# Patient Record
Sex: Female | Born: 1955
Health system: Southern US, Community
[De-identification: ages and names within clinical notes are randomized; demographics above are authoritative.]

## PROBLEM LIST (undated history)

## (undated) DIAGNOSIS — I10 Essential (primary) hypertension: Secondary | ICD-10-CM

## (undated) DIAGNOSIS — F32A Depression, unspecified: Secondary | ICD-10-CM

## (undated) HISTORY — PX: APPENDECTOMY: SHX54

---

## 1998-09-01 ENCOUNTER — Encounter: Admission: RE | Admit: 1998-09-01 | Discharge: 1998-09-01 | Payer: Self-pay | Admitting: Family Medicine

## 1998-09-18 ENCOUNTER — Other Ambulatory Visit: Admission: RE | Admit: 1998-09-18 | Discharge: 1998-09-18 | Payer: Self-pay | Admitting: *Deleted

## 2000-02-03 ENCOUNTER — Encounter: Admission: RE | Admit: 2000-02-03 | Discharge: 2000-02-03 | Payer: Self-pay | Admitting: Family Medicine

## 2000-02-18 ENCOUNTER — Encounter: Admission: RE | Admit: 2000-02-18 | Discharge: 2000-02-18 | Payer: Self-pay | Admitting: Family Medicine

## 2000-03-21 ENCOUNTER — Encounter: Admission: RE | Admit: 2000-03-21 | Discharge: 2000-03-21 | Payer: Self-pay | Admitting: Family Medicine

## 2001-06-21 ENCOUNTER — Encounter: Admission: RE | Admit: 2001-06-21 | Discharge: 2001-06-21 | Payer: Self-pay | Admitting: Family Medicine

## 2001-06-28 ENCOUNTER — Encounter: Admission: RE | Admit: 2001-06-28 | Discharge: 2001-06-28 | Payer: Self-pay | Admitting: Family Medicine

## 2005-11-01 ENCOUNTER — Emergency Department (HOSPITAL_COMMUNITY): Admission: EM | Admit: 2005-11-01 | Discharge: 2005-11-02 | Payer: Self-pay | Admitting: Emergency Medicine

## 2006-09-05 ENCOUNTER — Encounter (INDEPENDENT_AMBULATORY_CARE_PROVIDER_SITE_OTHER): Payer: Self-pay | Admitting: *Deleted

## 2006-09-05 LAB — CONVERTED CEMR LAB

## 2006-09-06 ENCOUNTER — Ambulatory Visit: Payer: Self-pay | Admitting: Sports Medicine

## 2006-09-06 ENCOUNTER — Other Ambulatory Visit: Admission: RE | Admit: 2006-09-06 | Discharge: 2006-09-06 | Payer: Self-pay | Admitting: Family Medicine

## 2006-10-09 ENCOUNTER — Emergency Department (HOSPITAL_COMMUNITY): Admission: EM | Admit: 2006-10-09 | Discharge: 2006-10-09 | Payer: Self-pay | Admitting: Emergency Medicine

## 2007-02-02 DIAGNOSIS — K649 Unspecified hemorrhoids: Secondary | ICD-10-CM | POA: Insufficient documentation

## 2007-02-02 DIAGNOSIS — J4489 Other specified chronic obstructive pulmonary disease: Secondary | ICD-10-CM | POA: Insufficient documentation

## 2007-02-02 DIAGNOSIS — R8789 Other abnormal findings in specimens from female genital organs: Secondary | ICD-10-CM

## 2007-02-02 DIAGNOSIS — F17200 Nicotine dependence, unspecified, uncomplicated: Secondary | ICD-10-CM | POA: Insufficient documentation

## 2007-02-02 DIAGNOSIS — J449 Chronic obstructive pulmonary disease, unspecified: Secondary | ICD-10-CM

## 2007-02-03 ENCOUNTER — Encounter (INDEPENDENT_AMBULATORY_CARE_PROVIDER_SITE_OTHER): Payer: Self-pay | Admitting: *Deleted

## 2020-09-30 ENCOUNTER — Encounter: Payer: Self-pay | Admitting: Internal Medicine

## 2020-12-09 ENCOUNTER — Ambulatory Visit: Payer: Medicare HMO

## 2020-12-09 ENCOUNTER — Ambulatory Visit: Payer: Medicare HMO | Admitting: Family Medicine

## 2020-12-09 ENCOUNTER — Other Ambulatory Visit: Payer: Self-pay

## 2020-12-09 ENCOUNTER — Ambulatory Visit (INDEPENDENT_AMBULATORY_CARE_PROVIDER_SITE_OTHER): Payer: Medicare HMO | Admitting: Family Medicine

## 2020-12-09 VITALS — BP 132/80 | HR 129 | Temp 96.1°F

## 2020-12-09 DIAGNOSIS — Z114 Encounter for screening for human immunodeficiency virus [HIV]: Secondary | ICD-10-CM

## 2020-12-09 DIAGNOSIS — F1010 Alcohol abuse, uncomplicated: Secondary | ICD-10-CM

## 2020-12-09 DIAGNOSIS — R112 Nausea with vomiting, unspecified: Secondary | ICD-10-CM | POA: Diagnosis not present

## 2020-12-09 DIAGNOSIS — R1110 Vomiting, unspecified: Secondary | ICD-10-CM | POA: Insufficient documentation

## 2020-12-09 NOTE — Patient Instructions (Signed)
It was a pleasure to see you today!  Thank you for choosing [facility] for your primary care.   Our plans for today were: - I will call you with the results - we will schedule you a ultrasound of your liver and gallbladder to further evaluate your nausea and vomiting.  Best Wishes,   Orpah Cobb, DO

## 2020-12-09 NOTE — Progress Notes (Signed)
Subjective:   Patient ID: Leah Woodward    DOB: 1956-05-26, 65 y.o. female   MRN: [id number]  Leah Woodward is a 65 y.o. female with a history of hemorrhoids, COPD, abnormal pap-smear, tobacco dependence here for nausea and vomiting.  Nausea, Vomiting: Nausea and vomiting x 2 weeks, intermittent, only occurs with certain foods such like hamburgers, cabbage. She has bene able to tolerate (baked chicken with noodles). Endorses low appetite.  Notes 3 episodes of vomiting. Mostly nausea after certain meals. Endorses low bilateral pain when her other symptoms occur. Vomiting helps her symptoms improve, but if she doesn't vomit then the nausea and stomach pain last about an hour. They self resolve. She has not taken anything for the symptoms. Her nausea and abdominal pain improve when she goes to the bathroom. Denies any bloody vomitus. Denies any diarrhea or constipation. Last bowel movement was this morning, brown in color, soft consistency. Denies any unexplained weight loss. Weight normally 108lbs. Denies any current symptoms. Denies any fever or chills. Endorses cough x years, slightly productive, about the same. H/o appendectomy. Eating and drinking okay today. Voiding normally. Denies any chest pain, SOB, or other symptoms with this nausea and vomiting.  Current Meds: none Tobacco: 1/3 PPD Alcohol: 1/2 pint QOD of Brandy  Illicit drugs: none  Denies any pertinent personal or family history Surigcal history: appendectomy    Review of Systems:  Per HPI.   Objective:   BP 132/80   Pulse (!) 129   Temp (!) 96.1 F (35.6 C) (Axillary)   SpO2 100%  Vitals and nursing note reviewed.  General: pleasant thin woman, sitting comfortably in exam chair, well nourished, well developed, in no acute distress with non-toxic appearance CV: regular rate and rhythm without murmurs, rubs, or gallops, no lower extremity edema Lungs: intermittent scattered rhonchi that cleared during exam,  normal work of breathing on room air, speaking in full sentences Abdomen: soft, diffusely non-tender, non-distended, no masses or organomegaly palpable, normoactive bowel sounds, no guarding, negative Murphey's sign, no rebound Skin: warm, dry Extremities: warm and well perfused, normal tone MSK: gait normal Neuro: Alert and oriented, speech normal  POCUS of RUQ: no significant signs of hepatomegaly or gall bladder disease  Assessment & Plan:   Non-intractable vomiting Acute intermittent nausea and NBNB vomiting associated with only some foods. Unclear etiology with broad differential including gallbladder pathology, acute hepatitis or other liver pathology, alcohol induced irritation (daily heavy alcohol use), chronic pancreatitis, electrolyte derangement. History not consistent with thyroid disease or obstruction. Benign exam and reassuring POCUS RUQ exam. Normal voids and stool and mainting hydration without issue. At this time, will obtain blood work including Hep A/B/C panel, CBC with diff, CMP, lipase as well as RUQ ultrasound and HIV to further evaluate. If inconclusive, will need to have patient follow up for further evaluation.   Orders Placed This Encounter  Procedures  . US Abdomen Limited RUQ (LIVER/GB)    Standing Status:   Future    Standing Expiration Date:   12/09/2021    Order Specific Question:   Reason for Exam (SYMPTOM  OR DIAGNOSIS REQUIRED)    Answer:   alcohol use, nausea and vomiting    Order Specific Question:   Preferred imaging location?    Answer:   [facility] [address] [facility]  . Hepatitis B surface antigen  . Hepatitis C antibody  . CBC with Differential  . Comprehensive metabolic panel  . Lipase  . Hepatitis A antibody, total  .  Hepatitis A Antibody, IGM  . HIV antibody (with reflex)   No orders of the defined types were placed in this encounter.   Leah Marble, DO PGY-3, [facility] 12/09/2020 5:07 PM

## 2020-12-09 NOTE — Assessment & Plan Note (Addendum)
Acute intermittent nausea and NBNB vomiting associated with only some foods. Unclear etiology with broad differential including gallbladder pathology, acute hepatitis or other liver pathology, alcohol induced irritation (daily heavy alcohol use), chronic pancreatitis, electrolyte derangement. History not consistent with thyroid disease or obstruction. Benign exam and reassuring POCUS RUQ exam. Normal voids and stool and mainting hydration without issue. At this time, will obtain blood work including Hep A/B/C panel, CBC with diff, CMP, lipase as well as RUQ ultrasound and HIV to further evaluate. If inconclusive, will need to have patient follow up for further evaluation.

## 2020-12-10 LAB — CBC WITH DIFFERENTIAL/PLATELET
Basophils Absolute: 0 10*3/uL (ref 0.0–0.2)
Basos: 1 %
EOS (ABSOLUTE): 0 10*3/uL (ref 0.0–0.4)
Eos: 0 %
Hematocrit: 35.2 % (ref 34.0–46.6)
Hemoglobin: 11.7 g/dL (ref 11.1–15.9)
Immature Grans (Abs): 0 10*3/uL (ref 0.0–0.1)
Immature Granulocytes: 1 %
Lymphocytes Absolute: 1.1 10*3/uL (ref 0.7–3.1)
Lymphs: 34 %
MCH: 30.8 pg (ref 26.6–33.0)
MCHC: 33.2 g/dL (ref 31.5–35.7)
MCV: 93 fL (ref 79–97)
Monocytes Absolute: 0.6 10*3/uL (ref 0.1–0.9)
Monocytes: 18 %
NRBC: 7 % — ABNORMAL HIGH (ref 0–0)
Neutrophils Absolute: 1.5 10*3/uL (ref 1.4–7.0)
Neutrophils: 46 %
Platelets: 154 10*3/uL (ref 150–450)
RBC: 3.8 x10E6/uL (ref 3.77–5.28)
RDW: 15 % (ref 11.7–15.4)
WBC: 3.2 10*3/uL — ABNORMAL LOW (ref 3.4–10.8)

## 2020-12-10 LAB — COMPREHENSIVE METABOLIC PANEL
ALT: 75 IU/L — ABNORMAL HIGH (ref 0–32)
AST: 253 IU/L — ABNORMAL HIGH (ref 0–40)
Albumin/Globulin Ratio: 1.5 (ref 1.2–2.2)
Albumin: 4.3 g/dL (ref 3.8–4.8)
Alkaline Phosphatase: 99 IU/L (ref 44–121)
BUN/Creatinine Ratio: 7 — ABNORMAL LOW (ref 12–28)
BUN: 7 mg/dL — ABNORMAL LOW (ref 8–27)
Bilirubin Total: 0.6 mg/dL (ref 0.0–1.2)
CO2: 21 mmol/L (ref 20–29)
Calcium: 9.1 mg/dL (ref 8.7–10.3)
Chloride: 97 mmol/L (ref 96–106)
Creatinine, Ser: 0.97 mg/dL (ref 0.57–1.00)
GFR calc Af Amer: 71 mL/min/{1.73_m2} (ref >59)
GFR calc non Af Amer: 61 mL/min/{1.73_m2} (ref >59)
Globulin, Total: 2.9 g/dL (ref 1.5–4.5)
Glucose: 99 mg/dL (ref 65–99)
Potassium: 3.7 mmol/L (ref 3.5–5.2)
Sodium: 138 mmol/L (ref 134–144)
Total Protein: 7.2 g/dL (ref 6.0–8.5)

## 2020-12-10 LAB — HEPATITIS C ANTIBODY: Hep C Virus Ab: <0.1 s/co ratio (ref 0.0–0.9)

## 2020-12-10 LAB — HEPATITIS A ANTIBODY, IGM: Hep A IgM: NEGATIVE

## 2020-12-10 LAB — LIPASE: Lipase: 26 U/L (ref 14–72)

## 2020-12-10 LAB — HEPATITIS B SURFACE ANTIGEN: Hepatitis B Surface Ag: NEGATIVE

## 2020-12-10 LAB — HIV ANTIBODY (ROUTINE TESTING W REFLEX): HIV Screen 4th Generation wRfx: NONREACTIVE

## 2020-12-10 LAB — HEPATITIS A ANTIBODY, TOTAL: hep A Total Ab: POSITIVE — AB

## 2020-12-16 ENCOUNTER — Telehealth: Payer: Self-pay

## 2020-12-16 NOTE — Telephone Encounter (Signed)
Called [facility] to schedule patient for Ultrasound.  GI stated that they had called patient on 12/11/2020 and LVM for patient to call office to schedule appointment.  Patient never called back to respond.  Called patient and she states that she no longer wants ultrasound and that she is "feeling better". Her appetite is coming back and there is no pain or vomiting.  Informed patient that she should call [facility] if she starts having issues again of if she decides that she needs the Ultrasound at a later date.  Ozella Almond, [place]

## 2021-01-15 ENCOUNTER — Ambulatory Visit: Payer: Medicare HMO | Admitting: Family Medicine

## 2021-01-23 ENCOUNTER — Other Ambulatory Visit: Payer: Self-pay

## 2021-01-23 ENCOUNTER — Ambulatory Visit (INDEPENDENT_AMBULATORY_CARE_PROVIDER_SITE_OTHER): Payer: Medicare HMO | Admitting: Family Medicine

## 2021-01-23 ENCOUNTER — Encounter: Payer: Self-pay | Admitting: Family Medicine

## 2021-01-23 VITALS — BP 152/90 | HR 111 | Ht 65.0 in | Wt 96.1 lb

## 2021-01-23 DIAGNOSIS — Z13820 Encounter for screening for osteoporosis: Secondary | ICD-10-CM | POA: Diagnosis not present

## 2021-01-23 DIAGNOSIS — Z0000 Encounter for general adult medical examination without abnormal findings: Secondary | ICD-10-CM

## 2021-01-23 DIAGNOSIS — Z1231 Encounter for screening mammogram for malignant neoplasm of breast: Secondary | ICD-10-CM | POA: Diagnosis not present

## 2021-01-23 DIAGNOSIS — Z1211 Encounter for screening for malignant neoplasm of colon: Secondary | ICD-10-CM

## 2021-01-23 DIAGNOSIS — G479 Sleep disorder, unspecified: Secondary | ICD-10-CM | POA: Diagnosis not present

## 2021-01-23 DIAGNOSIS — Z7289 Other problems related to lifestyle: Secondary | ICD-10-CM | POA: Diagnosis not present

## 2021-01-23 DIAGNOSIS — R0602 Shortness of breath: Secondary | ICD-10-CM | POA: Diagnosis not present

## 2021-01-23 DIAGNOSIS — Z789 Other specified health status: Secondary | ICD-10-CM

## 2021-01-23 DIAGNOSIS — A6000 Herpesviral infection of urogenital system, unspecified: Secondary | ICD-10-CM

## 2021-01-23 DIAGNOSIS — J449 Chronic obstructive pulmonary disease, unspecified: Secondary | ICD-10-CM | POA: Diagnosis not present

## 2021-01-23 MED ORDER — ACYCLOVIR 200 MG PO CAPS: 200.0000 mg | ORAL_CAPSULE | Freq: Every day | ORAL | 2 refills | Status: AC

## 2021-01-23 MED ORDER — ALBUTEROL SULFATE HFA 108 (90 BASE) MCG/ACT IN AERS: 2.0000 | INHALATION_SPRAY | RESPIRATORY_TRACT | 0 refills | Status: DC | PRN

## 2021-01-23 MED ORDER — MELATONIN 5 MG PO TABS: 5.0000 mg | ORAL_TABLET | Freq: Every evening | ORAL | 2 refills | Status: DC | PRN

## 2021-01-23 NOTE — Patient Instructions (Signed)
It was great seeing you today!  Please check-out at the front desk before leaving the clinic. I'd like to see you back in 1 month for PAP (02/23/21 at 10:10 AM) but if you need to be seen earlier than that for any new issues we're happy to fit you in, just give [place] a call!  Visit Remembers: - Stop by the pharmacy to pick up your prescriptions  - Continue to work on your healthy eating habits and incorporating exercise into your daily life.    Regarding lab work today:  Due to recent changes in healthcare laws, you may see the results of your imaging and laboratory studies on MyChart before your provider has had a chance to review them.  I understand that in some cases there may be results that are confusing or concerning to you. Not all laboratory results come back in the same time frame and you may be waiting for multiple results in order to interpret others.  Please give [place] 72 hours in order for your provider to thoroughly review all the results before contacting the office for clarification of your results. If everything is normal, you will get a letter in the mail or a message in My Chart. Please give [place] a call if you do not hear from [place] after 2 weeks.  Please bring all of your medications with you to each visit.    If you haven't already, sign up for My Chart to have easy access to your labs results, and communication with your primary care physician.  Feel free to call with any questions or concerns at any time, at [phone].   Take care,  Dr. Rushie [facility]

## 2021-01-23 NOTE — Progress Notes (Signed)
   SUBJECTIVE:   CHIEF COMPLAINT / HPI:      Leah Woodward is a 65 y.o. female here to establish care. Has no concerns today.   Patient previously worked in [employer]. Smokes 2-3 cigs daily but previously smoked up to a 1/2 ppd since she was 65 years old. Drinks "two shots" of liquor in the evenings. Continues to have intermittent vomiting but has improved greatly since her previous [facility] visit.    Has chronic cough, and hx of COPD. Declines COVID and influenza vaccines. She is not UTD on cancer screening but is interested in doing so. Had appendectomy at age 48.     PERTINENT  PMH / PSH: reviewed and updated as appropriate   OBJECTIVE:   BP (!) 152/90   Pulse (!) 111   Ht 5\' 5"  (1.651 m)   Wt 96 lb 2 oz (43.6 kg)   SpO2 99%   BMI 16.00 kg/m    GEN: pleasant older female, in no acute distress CV: regular rate and rhythm, no murmurs appreciated RESP: no increased work of breathing, clear to ascultation bilaterally ABD: Bowel sounds present. Soft, Nontender, Nondistended. No hepatomegaly MSK: no edema, thin extremities  SKIN: warm, dry NEURO: grossly normal, moves all extremities appropriately PSYCH: Normal affect, appropriate speech and behavior    ASSESSMENT/PLAN:   Genital herpes simplex Current outbreak.  Acyclovir sent to pharmacy.   Healthcare maintenance Declines Covid and flu vaccinations.  Will schedule Pap smear.  Declined lab work today.  Referral to GI for colonoscopy placed.  Referral for mammogram and DEXA scan placed.  Alcohol use Evidence of chronic alcohol use on recent CMP.  Discussed need for right upper quadrant ultrasound.  This order was placed by Dr. Tarry Kos previously.  Patient states that she will think about getting this.  Continue to counsel patient on alcohol cessation.  COPD Albuterol sent to pharmacy.  Patient may likely benefit from controller medication.  Discussed at next appointment.  Difficulty sleeping Likely  multifactorial.  Reviewed sleep hygiene.  Trial melatonin.     Lyndee Hensen, DO PGY-2, [facility] 01/26/2021

## 2021-01-26 DIAGNOSIS — Z7289 Other problems related to lifestyle: Secondary | ICD-10-CM | POA: Insufficient documentation

## 2021-01-26 DIAGNOSIS — Z0000 Encounter for general adult medical examination without abnormal findings: Secondary | ICD-10-CM | POA: Insufficient documentation

## 2021-01-26 DIAGNOSIS — G479 Sleep disorder, unspecified: Secondary | ICD-10-CM | POA: Insufficient documentation

## 2021-01-26 DIAGNOSIS — A6000 Herpesviral infection of urogenital system, unspecified: Secondary | ICD-10-CM | POA: Insufficient documentation

## 2021-01-26 DIAGNOSIS — F1010 Alcohol abuse, uncomplicated: Secondary | ICD-10-CM | POA: Insufficient documentation

## 2021-01-26 NOTE — Assessment & Plan Note (Signed)
Albuterol sent to pharmacy.  Patient may likely benefit from controller medication.  Discussed at next appointment.

## 2021-01-26 NOTE — Assessment & Plan Note (Signed)
Likely multifactorial.  Reviewed sleep hygiene.  Trial melatonin.

## 2021-01-26 NOTE — Assessment & Plan Note (Signed)
Evidence of chronic alcohol use on recent CMP.  Discussed need for right upper quadrant ultrasound.  This order was placed by Dr. Tarry Kos previously.  Patient states that she will think about getting this.  Continue to counsel patient on alcohol cessation.

## 2021-01-26 NOTE — Assessment & Plan Note (Deleted)
Patient to schedule PAP sme

## 2021-01-26 NOTE — Assessment & Plan Note (Signed)
Declines Covid and flu vaccinations.  Will schedule Pap smear.  Declined lab work today.  Referral to GI for colonoscopy placed.  Referral for mammogram and DEXA scan placed.

## 2021-01-26 NOTE — Assessment & Plan Note (Signed)
Current outbreak.  Acyclovir sent to pharmacy.

## 2021-02-23 ENCOUNTER — Encounter: Payer: Self-pay | Admitting: Family Medicine

## 2021-02-23 ENCOUNTER — Other Ambulatory Visit (HOSPITAL_COMMUNITY)
Admission: RE | Admit: 2021-02-23 | Discharge: 2021-02-23 | Disposition: A | Discharge status: Home / Self Care | Payer: Medicare HMO | Source: Ambulatory Visit | Attending: Family Medicine | Admitting: Family Medicine

## 2021-02-23 ENCOUNTER — Other Ambulatory Visit: Payer: Self-pay

## 2021-02-23 ENCOUNTER — Ambulatory Visit (INDEPENDENT_AMBULATORY_CARE_PROVIDER_SITE_OTHER): Payer: Medicare HMO | Admitting: Family Medicine

## 2021-02-23 VITALS — BP 130/70 | HR 94 | Ht 65.0 in | Wt 97.4 lb

## 2021-02-23 DIAGNOSIS — N949 Unspecified condition associated with female genital organs and menstrual cycle: Secondary | ICD-10-CM | POA: Diagnosis not present

## 2021-02-23 DIAGNOSIS — Z124 Encounter for screening for malignant neoplasm of cervix: Secondary | ICD-10-CM | POA: Insufficient documentation

## 2021-02-23 DIAGNOSIS — Z1151 Encounter for screening for human papillomavirus (HPV): Secondary | ICD-10-CM | POA: Diagnosis not present

## 2021-02-23 DIAGNOSIS — Z113 Encounter for screening for infections with a predominantly sexual mode of transmission: Secondary | ICD-10-CM | POA: Insufficient documentation

## 2021-02-23 DIAGNOSIS — Z7289 Other problems related to lifestyle: Secondary | ICD-10-CM

## 2021-02-23 DIAGNOSIS — Z01419 Encounter for gynecological examination (general) (routine) without abnormal findings: Secondary | ICD-10-CM | POA: Insufficient documentation

## 2021-02-23 DIAGNOSIS — Z0000 Encounter for general adult medical examination without abnormal findings: Secondary | ICD-10-CM

## 2021-02-23 DIAGNOSIS — Z789 Other specified health status: Secondary | ICD-10-CM

## 2021-02-23 NOTE — Assessment & Plan Note (Addendum)
PAP today, ordered GC/CT, HSV and trichomonas testing. Has mammogram and DEXA scan scheduled for 03/27/21. Referral for colonoscopy previously but not yet scheduled. Provided [facility] contact information (see AVS) for pt to schedule. Declined PNA vaccine today as she wants to think about it first. Declined COVID vaccine.

## 2021-02-23 NOTE — Patient Instructions (Addendum)
It was great seeing you today!   Call to schedule your colonoscopy: [facility]  [place]  [phone]  Please be sure to keep your scheduled ultrasound appointment for your liver!    If you have questions or concerns please do not hesitate to call at [phone].  Dr. Rushie [facility]

## 2021-02-23 NOTE — Assessment & Plan Note (Addendum)
RUQ [place] previously ordered by Dr. Tarry Kos. CMP with evidence of chronic alcohol use. Pt agreeable to [place] today. Scheduled for 04/10/21. Previously reports 2 shot of liquor daily. Today reported 3 shots 3 times a week. Repeat CMP at follow up.

## 2021-02-23 NOTE — Assessment & Plan Note (Signed)
Pt reports hx of HSV but recent treatment with acyclovir did not help. HSV ordered on PAP today. On exam, lesions appear to be genital warts. Discuss treatment options with pt. Consider topical therapy.

## 2021-02-23 NOTE — Progress Notes (Signed)
   SUBJECTIVE:   CHIEF COMPLAINT / HPI:   Chief Complaint  Patient presents with  . Gynecologic Exam     Leah Woodward is a 65 y.o. female here for PAP.   Pt reports no vaginal discharge. Reports having an HSV flare and the medication given to her last time did not work. Has burning sensation when she sits in the bathtub. She reports taking a medication that helped her in the past.   Reports drinking 3 shots of liquor three times a week. She has not gotten the RUQ ultrasound that was ordered previously by Dr. Tarry Kos.    PERTINENT  PMH / PSH: reviewed and updated as appropriate   OBJECTIVE:   BP 130/70   Pulse 94   Ht 5\' 5"  (1.651 m)   Wt 97 lb 6 oz (44.2 kg)   SpO2 98%   BMI 16.20 kg/m    GEN: well appearing female in no acute distress  CVS: well perfused  RESP: speaking in full sentences without pause  ABD: soft, non-tender, non-distended, no palpable masses  Pelvic exam: several grouped papules on bilateral vulva, VAGINA and CERVIX: normal appearing cervix without discharge or lesions, ADNEXA: normal adnexa in size, nontender and no masses, PAP collected, exam chaperoned by CMA Tashira.  EXT: thin  Skin: warm, dry, GU see above    ASSESSMENT/PLAN:   Alcohol use RUQ [place] previously ordered by Dr. Tarry Kos. CMP with evidence of chronic alcohol use. Pt agreeable to [place] today. Scheduled for 04/10/21. Previously reports 2 shot of liquor daily. Today reported 3 shots 3 times a week. Repeat CMP at follow up.   Healthcare maintenance PAP today, ordered GC/CT, HSV and trichomonas testing. Has mammogram and DEXA scan scheduled for 03/27/21. Referral for colonoscopy previously but not yet scheduled. Provided [facility] contact information (see AVS) for pt to schedule. Declined PNA vaccine today as she wants to think about it first. Declined COVID vaccine.    Genital lesion, female Pt reports hx of HSV but recent treatment with acyclovir did not help. HSV ordered on PAP today.  On exam, lesions appear to be genital warts. Discuss treatment options with pt. Consider topical therapy.     Lyndee Hensen, DO PGY-2, [facility] 02/23/2021

## 2021-02-26 ENCOUNTER — Encounter: Payer: Self-pay | Admitting: Family Medicine

## 2021-02-26 LAB — CYTOLOGY - PAP
Adequacy: ABSENT
Chlamydia: NEGATIVE
Comment: NEGATIVE
Comment: NEGATIVE
Comment: NEGATIVE
Comment: NEGATIVE
Comment: NORMAL
Diagnosis: NEGATIVE
HSV1: NEGATIVE
HSV2: NEGATIVE
High risk HPV: NEGATIVE
Neisseria Gonorrhea: NEGATIVE
Trichomonas: NEGATIVE

## 2021-02-26 NOTE — Progress Notes (Signed)
Letter sent.

## 2021-03-27 ENCOUNTER — Ambulatory Visit: Payer: Medicare HMO

## 2021-04-10 ENCOUNTER — Telehealth: Payer: Self-pay

## 2021-04-10 ENCOUNTER — Other Ambulatory Visit: Payer: Medicare HMO

## 2021-04-10 NOTE — Telephone Encounter (Signed)
Attempted to call patient, no answer. Left HIPAA compliant VM for patient to return call to office to schedule an appointment.   Talbot Grumbling, RN

## 2021-04-10 NOTE — Telephone Encounter (Signed)
Patient calls nurse line requesting medication for vaginal lesions. Patient reports this was discussed at visit on 3/21.   Please advise.   Talbot Grumbling, RN

## 2021-04-15 ENCOUNTER — Ambulatory Visit: Payer: Medicare HMO | Admitting: Family Medicine

## 2021-05-12 ENCOUNTER — Other Ambulatory Visit: Payer: Self-pay

## 2021-05-12 ENCOUNTER — Ambulatory Visit (INDEPENDENT_AMBULATORY_CARE_PROVIDER_SITE_OTHER): Payer: Medicare HMO | Admitting: Student in an Organized Health Care Education/Training Program

## 2021-05-12 ENCOUNTER — Encounter: Payer: Self-pay | Admitting: Student in an Organized Health Care Education/Training Program

## 2021-05-12 VITALS — BP 146/80 | HR 114 | Wt 92.2 lb

## 2021-05-12 DIAGNOSIS — Z789 Other specified health status: Secondary | ICD-10-CM

## 2021-05-12 DIAGNOSIS — Z7289 Other problems related to lifestyle: Secondary | ICD-10-CM | POA: Diagnosis not present

## 2021-05-12 DIAGNOSIS — R000 Tachycardia, unspecified: Secondary | ICD-10-CM

## 2021-05-12 DIAGNOSIS — Z0000 Encounter for general adult medical examination without abnormal findings: Secondary | ICD-10-CM

## 2021-05-12 DIAGNOSIS — N750 Cyst of Bartholin's gland: Secondary | ICD-10-CM

## 2021-05-12 DIAGNOSIS — F17200 Nicotine dependence, unspecified, uncomplicated: Secondary | ICD-10-CM | POA: Diagnosis not present

## 2021-05-12 DIAGNOSIS — R7401 Elevation of levels of liver transaminase levels: Secondary | ICD-10-CM | POA: Diagnosis not present

## 2021-05-12 DIAGNOSIS — F1020 Alcohol dependence, uncomplicated: Secondary | ICD-10-CM | POA: Diagnosis not present

## 2021-05-12 DIAGNOSIS — N949 Unspecified condition associated with female genital organs and menstrual cycle: Secondary | ICD-10-CM | POA: Diagnosis not present

## 2021-05-12 NOTE — Assessment & Plan Note (Signed)
Reduced to 2 shots per day.

## 2021-05-12 NOTE — Progress Notes (Addendum)
SUBJECTIVE:   CHIEF COMPLAINT / HPI: genital bumps  transaminitis- last check up 1/22 reveled elevated AST/ALT 253/75 and was recommended to decrease drinking and have f/u RUQ [place]. Patient has decreased alcohol intake to approximately 2shots per day and she feels great. Denies withdrawal symptoms.   Genital lesions- present for several months. She thinks her PCP was going to send in a cream but never heard back so returns for reevaluation today. Describes non-painful growths in her vaginal area. They have enlarged since last visit. She sits in baths and thinks the soap irritates them. No fever, drainage, pain.  Not sexually active for over 5 years. No pain.   Chronic tachycardia- patient endorses feeling racing heart two times in her history remotely. Otherwise she is asymptomatic and denies chest pain or SOB. Has never had an echo.  TSH, echo  Smokes 1/2ppd. Smoked for 30 years and used to smoke a pack per day but has been cutting back recently.   bereavement- father recently passed away. She is going out of town soon to his funeral and may be gone a month. He had 17 children so she feels she has a very large support group and is doing well overall.   Elevated BP reading- asymptomatic. Not on therapy. Previous elevated readings.   Health maintenance- Mammogram scheduled. Colonoscopy due and patient is open to a referral. dexa scan ordered but not scheduled.  Patient would be recommended to get lung cancer screening based on USPSTF guidelines.  OBJECTIVE:   BP (!) 146/80   Pulse (!) 114   Wt 92 lb 3.2 oz (41.8 kg)   SpO2 98%   BMI 15.34 kg/m   Physical Exam Vitals and nursing note reviewed. Exam conducted with a chaperone present.  Constitutional:      General: She is not in acute distress.    Appearance: She is not toxic-appearing.  Cardiovascular:     Rate and Rhythm: Regular rhythm. Tachycardia present.     Pulses: Normal pulses.     Heart sounds: Normal heart sounds. No  murmur heard.   Pulmonary:     Effort: Pulmonary effort is normal.     Breath sounds: Normal breath sounds.  Abdominal:     Palpations: Abdomen is soft.     Tenderness: There is no abdominal tenderness.  Genitourinary:    Exam position: Lithotomy position.     Labia:        Right: No rash or tenderness.        Left: No rash or tenderness.      Comments: 3 bartholin cysts within labia Skin:    Capillary Refill: Capillary refill takes less than 2 seconds.  Neurological:     General: No focal deficit present.     Mental Status: She is alert and oriented to person, place, and time.  Psychiatric:        Mood and Affect: Mood normal.    ASSESSMENT/PLAN:   Transaminitis Likely related to alcohol use.  Repeat CMP, acute viral hepatitis ordered today Recommended f/u with RUQ [place]. Patient requested that she schedule it herself since she's going out of town and not sure when she will be back so the contact number was provided to her.   Genital lesion, female On exam today- appears to be 3 bartholin cysts within labia. Non-erythematous, warm or draining.  Do not see any ulcerations or condylomata today. Recommended warm compresses and gyn referral for evaluation and consider biopsy for rare cancer of bartholin cysts  in post-menopausal women.   Tachycardia Appears to be chronic and asymptomatic.  Rhythm is normal on exam. TSH today in conjunction of evaluation of weight loss as well could be a possibility.  Echo ordered. Again, patient given contact information to schedule herself since she is not sure how long she will be out of town for.  TOBACCO DEPENDENCE She has cut back to about 1/2ppd. Recommended lung cancer screening and patient declined.   Alcohol use Reduced to 2 shots per day.  Healthcare maintenance GI referral for colonoscopy placed today dexa scan previously ordered but not scheduled Mammogram scheduled this month     Richarda Osmond, [place]

## 2021-05-12 NOTE — Assessment & Plan Note (Signed)
On exam today- appears to be 3 bartholin cysts within labia. Non-erythematous, warm or draining.  Do not see any ulcerations or condylomata today. Recommended warm compresses and gyn referral for evaluation and consider biopsy for rare cancer of bartholin cysts in post-menopausal women.

## 2021-05-12 NOTE — Assessment & Plan Note (Signed)
Appears to be chronic and asymptomatic.  Rhythm is normal on exam. TSH today in conjunction of evaluation of weight loss as well could be a possibility.  Echo ordered. Again, patient given contact information to schedule herself since she is not sure how long she will be out of town for.

## 2021-05-12 NOTE — Patient Instructions (Signed)
It was a pleasure to see you today!  To summarize our discussion for this visit:  We are checking on your liver and thyroid today with blood work.   It looks like the bumps in your vaginal area are cysts and I recommend warm compresses  Please call to schedule the ultrasounds when you are back in town.  [phone] for ultrasound of liver  [phone] for the heart ultrasound  Congratulations on cutting back on smoking and alcohol use. Keep up the good work!  Some additional health maintenance measures we should update are: Health Maintenance Due  Topic Date Due  . COVID-19 Vaccine (1) Never done  . Pneumococcal Vaccine 85-28 Years old (1 of 2 - PPSV23) Never done  . TETANUS/TDAP  Never done  . COLONOSCOPY (Pts 45-6yrs Insurance coverage will need to be confirmed)  Never done  . MAMMOGRAM  Never done  . Zoster Vaccines- Shingrix (1 of 2) Never done  . DEXA SCAN  Never done  . PNA vac Low Risk Adult (1 of 2 - PCV13) Never done  .    Call the clinic at [phone] if your symptoms worsen or you have any concerns.   Thank you for allowing me to take part in your care,  Dr. Doristine Mango   Bartholin's Cyst  A Bartholin's cyst is a fluid-filled sac that forms on a Bartholin's gland. Bartholin's glands are small glands in the folds of skin near the opening of the vagina (labia). This type of cyst causes a bulge or lump near the opening of the vagina. If you have a cyst that is small and not infected, you may be able to take care of it at home. If your cyst gets infected, it may cause pain and your doctor may need to drain it. What are the causes? This condition may be caused by a blocked Bartholin's gland. Germs (bacteria) inside of the cyst can cause an infection. What are the signs or symptoms?  A bulge or lump near the opening of the vagina.  Discomfort or pain.  Redness, swelling, or fluid draining from the area. How is this treated? You may not need treatment  if your cyst is not causing symptoms. The cyst can go away on its own with home care. Home care includes hot baths or heat therapy. Large cysts or cysts that are infected may be treated with:  Antibiotic medicine.  A procedure to drain the fluid. Cysts that keep coming back will need to be drained many times. Your doctor may talk to you about surgery to remove the cyst. Follow these instructions at home: Medicines  Take over-the-counter and prescription medicines only as told by your doctor.  If you were prescribed an antibiotic medicine, take it as told by your doctor. Do not stop taking it even if you start to feel better. Managing pain and swelling  Try sitz baths to help with pain and swelling. A sitz bath is a warm water bath in which the water only comes up to your hips and should cover your buttocks. You may take sitz baths a few times a day.  If told, put heat on the affected area as often as needed. Use the heat source that your doctor recommends, such as a moist heat pack or a heating pad. ? Place a towel between your skin and the heat source. ? Leave the heat on for 20-30 minutes. ? Take off the heat if your skin turns bright red. This is very  important. If you cannot feel pain, heat, or cold, you have a greater risk of getting burned. General instructions  If your cyst was drained: ? Follow instructions from your doctor about how to take care of your wound. ? Use feminine pads to absorb any fluid.  Do not push on or squeeze your cyst.  Do not have sex until the cyst has gone away or your wound from drainage has healed.  Take these steps to help prevent a cyst from returning, and to prevent other cysts from forming: ? Take a bath or shower once a day. Clean the area around your vagina with mild soap and water when you bathe. ? Practice safe sex to prevent STIs. Talk with your doctor about how to prevent STIs and which forms of birth control to use.  Keep all follow-up  visits. Contact a doctor if:  You have a fever.  You get more redness, swelling, or pain around your cyst.  You have fluid, blood, pus, or a bad smell coming from your cyst.  You have a cyst that gets larger or a cyst that comes back. Summary  A Bartholin's cyst is a fluid-filled sac that forms on a Bartholin's gland. These small glands are found in the folds of skin near the opening of the vagina (labia).  This type of cyst causes a bulge or lump near the opening of the vagina.  Try sitz baths a few times a day to help with pain and swelling.  Do not push on or squeeze your cyst. This information is not intended to replace advice given to you by your health care provider. Make sure you discuss any questions you have with your health care provider. Document Revised: 04/21/2020 Document Reviewed: 04/21/2020 Elsevier Patient Education  [place].

## 2021-05-12 NOTE — Assessment & Plan Note (Signed)
She has cut back to about 1/2ppd. Recommended lung cancer screening and patient declined.

## 2021-05-12 NOTE — Assessment & Plan Note (Signed)
GI referral for colonoscopy placed today dexa scan previously ordered but not scheduled Mammogram scheduled this month

## 2021-05-12 NOTE — Assessment & Plan Note (Signed)
Likely related to alcohol use.  Repeat CMP, acute viral hepatitis ordered today Recommended f/u with RUQ [place]. Patient requested that she schedule it herself since she's going out of town and not sure when she will be back so the contact number was provided to her.

## 2021-05-13 LAB — ACUTE VIRAL HEPATITIS (HAV, HBV, HCV)
HCV Ab: <0.1 s/co ratio (ref 0.0–0.9)
Hep A IgM: NEGATIVE
Hep B C IgM: NEGATIVE
Hepatitis B Surface Ag: NEGATIVE

## 2021-05-13 LAB — COMPREHENSIVE METABOLIC PANEL
ALT: 49 IU/L — ABNORMAL HIGH (ref 0–32)
AST: 119 IU/L — ABNORMAL HIGH (ref 0–40)
Albumin/Globulin Ratio: 1.4 (ref 1.2–2.2)
Albumin: 4 g/dL (ref 3.8–4.8)
Alkaline Phosphatase: 115 IU/L (ref 44–121)
BUN/Creatinine Ratio: 11 — ABNORMAL LOW (ref 12–28)
BUN: 6 mg/dL — ABNORMAL LOW (ref 8–27)
Bilirubin Total: 0.9 mg/dL (ref 0.0–1.2)
CO2: 22 mmol/L (ref 20–29)
Calcium: 9.5 mg/dL (ref 8.7–10.3)
Chloride: 103 mmol/L (ref 96–106)
Creatinine, Ser: 0.56 mg/dL — ABNORMAL LOW (ref 0.57–1.00)
Globulin, Total: 2.8 g/dL (ref 1.5–4.5)
Glucose: 113 mg/dL — ABNORMAL HIGH (ref 65–99)
Potassium: 4.1 mmol/L (ref 3.5–5.2)
Sodium: 144 mmol/L (ref 134–144)
Total Protein: 6.8 g/dL (ref 6.0–8.5)
eGFR: 101 mL/min/{1.73_m2} (ref >59)

## 2021-05-13 LAB — HCV INTERPRETATION

## 2021-05-13 LAB — TSH: TSH: 2.24 u[IU]/mL (ref 0.450–4.500)

## 2021-05-15 ENCOUNTER — Encounter: Payer: Self-pay | Admitting: Student in an Organized Health Care Education/Training Program

## 2021-05-19 ENCOUNTER — Ambulatory Visit: Payer: Medicare HMO

## 2021-08-18 ENCOUNTER — Other Ambulatory Visit: Payer: Self-pay | Admitting: Family Medicine

## 2021-08-18 DIAGNOSIS — R0602 Shortness of breath: Secondary | ICD-10-CM

## 2021-10-07 ENCOUNTER — Ambulatory Visit: Payer: Medicare HMO

## 2021-10-25 NOTE — Progress Notes (Signed)
   SUBJECTIVE:   CHIEF COMPLAINT / HPI:    Leah Woodward is a 65 y.o. female here to discuss having issues eating.  Pt here with her sister who is also concerned about pt's drinking and eating issues. Sister stated pt drinks too much alcohol. Pt reports she drinks 1-pint of liquor every 2-3 days. She admits to wanting to quit drinking as she has a drinking problem. States she doesn't get "drunk" though.   Pt reports losing weight without trying. She has no appetite.  She has nausea but no vomiting, no diarrhea. Denies melena, hematuria, hematochezia, cough or hemoptysis. She notes increased stress in her life. Her cancer screenings are not up-to-date. Smokes 1 cigarette a day, previously smoked 1/2 ppd 40 years. Endorses shortness of breath that has increased recently.    PERTINENT  PMH / PSH: reviewed and updated as appropriate   OBJECTIVE:   BP 117/88   Pulse (!) 117   Wt 88 lb 8 oz (40.1 kg)   SpO2 100%   BMI 14.73 kg/m    GEN: thin appearing older female, in no acute distress  CV: regular rate and rhythm, no murmurs appreciated  RESP: no increased work of breathing, clear to ascultation bilaterally ABD: Bowel sounds present. Soft, non-tender, non-distended, no palpable masses MSK: no edema, thin extremities  SKIN: warm, dry NEURO: alert, moves all extremities appropriately PSYCH: Normal affect, appropriate speech and behavior    ASSESSMENT/PLAN:   Alcohol use Offered Naltrexone however pt declined at this time. She wanted to speak with a therapist first. PHQ 9 score 7.  AST/ALT previously elevated. Hep C normal in June 2022.  - follow up RUQ [place]  Healthcare maintenance DEXA Scan ordered. Pt to set up with breast imaging.   Unintended weight loss Pt is a 65-yo-female who is not up-to-date with her cancer screenings. On chart review, previous weights 92-97 lbs in June and Feb 2022. PAP in March 2022 was HPV and intraepithelial lesion negative.  Discussed obtaining  mammogram, colonoscopy and low-dose CT patient agreeable. Sister will ensure pt has a ride to her appointments.   TOBACCO DEPENDENCE Obtain low-dose CT.      Discuss vaccines: at next follow up   Lyndee Hensen, DO PGY-3, [facility] 10/29/2021

## 2021-10-26 ENCOUNTER — Encounter: Payer: Self-pay | Admitting: Family Medicine

## 2021-10-26 ENCOUNTER — Ambulatory Visit (INDEPENDENT_AMBULATORY_CARE_PROVIDER_SITE_OTHER): Payer: Medicare HMO | Admitting: Family Medicine

## 2021-10-26 ENCOUNTER — Other Ambulatory Visit: Payer: Self-pay

## 2021-10-26 VITALS — BP 117/88 | HR 117 | Wt 88.5 lb

## 2021-10-26 DIAGNOSIS — Z0000 Encounter for general adult medical examination without abnormal findings: Secondary | ICD-10-CM | POA: Diagnosis not present

## 2021-10-26 DIAGNOSIS — Z720 Tobacco use: Secondary | ICD-10-CM | POA: Diagnosis not present

## 2021-10-26 DIAGNOSIS — R0602 Shortness of breath: Secondary | ICD-10-CM | POA: Diagnosis not present

## 2021-10-26 DIAGNOSIS — Z789 Other specified health status: Secondary | ICD-10-CM

## 2021-10-26 DIAGNOSIS — R634 Abnormal weight loss: Secondary | ICD-10-CM | POA: Diagnosis not present

## 2021-10-26 DIAGNOSIS — F1010 Alcohol abuse, uncomplicated: Secondary | ICD-10-CM

## 2021-10-26 DIAGNOSIS — F17200 Nicotine dependence, unspecified, uncomplicated: Secondary | ICD-10-CM

## 2021-10-26 MED ORDER — ALBUTEROL SULFATE HFA 108 (90 BASE) MCG/ACT IN AERS
2.0000 | INHALATION_SPRAY | RESPIRATORY_TRACT | 0 refills | Status: DC | PRN
Start: 2021-10-26 — End: 2022-04-07

## 2021-10-26 NOTE — Patient Instructions (Signed)
It was great seeing you today!  Look out for a call to schedule your liver ultrasound, colon and lung cancer screenings.   Call [facility] to schedule your breast and bone screenings.    Regarding lab work today:  Due to recent changes in healthcare laws, you may see the results of your imaging and laboratory studies on MyChart before your provider has had a chance to review them.  I understand that in some cases there may be results that are confusing or concerning to you. Not all laboratory results come back in the same time frame and you may be waiting for multiple results in order to interpret others.  Please give [place] 72 hours in order for your provider to thoroughly review all the results before contacting the office for clarification of your results. If everything is normal, you will get a letter in the mail or a message in My Chart. Please give [place] a call if you do not hear from [place] after 2 weeks.  If you haven't already, sign up for My Chart to have easy access to your labs results, and communication with your primary care physician.  Feel free to call with any questions or concerns at any time, at [phone].   Take care,  Dr. Rushie [facility]

## 2021-10-27 LAB — THYROID PANEL WITH TSH
Free Thyroxine Index: 1.7 (ref 1.2–4.9)
T3 Uptake Ratio: 25 % (ref 24–39)
T4, Total: 6.9 ug/dL (ref 4.5–12.0)
TSH: 3.03 u[IU]/mL (ref 0.450–4.500)

## 2021-10-27 LAB — COMPREHENSIVE METABOLIC PANEL
ALT: 95 IU/L — ABNORMAL HIGH (ref 0–32)
AST: 315 IU/L — ABNORMAL HIGH (ref 0–40)
Albumin/Globulin Ratio: 1.6 (ref 1.2–2.2)
Albumin: 3.6 g/dL — ABNORMAL LOW (ref 3.8–4.8)
Alkaline Phosphatase: 149 IU/L — ABNORMAL HIGH (ref 44–121)
BUN/Creatinine Ratio: 14 (ref 12–28)
BUN: 8 mg/dL (ref 8–27)
Bilirubin Total: 1.3 mg/dL — ABNORMAL HIGH (ref 0.0–1.2)
CO2: 26 mmol/L (ref 20–29)
Calcium: 8.9 mg/dL (ref 8.7–10.3)
Chloride: 100 mmol/L (ref 96–106)
Creatinine, Ser: 0.58 mg/dL (ref 0.57–1.00)
Globulin, Total: 2.3 g/dL (ref 1.5–4.5)
Glucose: 160 mg/dL — ABNORMAL HIGH (ref 70–99)
Potassium: 3.8 mmol/L (ref 3.5–5.2)
Sodium: 145 mmol/L — ABNORMAL HIGH (ref 134–144)
Total Protein: 5.9 g/dL — ABNORMAL LOW (ref 6.0–8.5)
eGFR: 100 mL/min/{1.73_m2} (ref >59)

## 2021-10-27 LAB — CBC
Hematocrit: 26.1 % — ABNORMAL LOW (ref 34.0–46.6)
Hemoglobin: 8.8 g/dL — ABNORMAL LOW (ref 11.1–15.9)
MCH: 31.1 pg (ref 26.6–33.0)
MCHC: 33.7 g/dL (ref 31.5–35.7)
MCV: 92 fL (ref 79–97)
NRBC: 3 % — ABNORMAL HIGH (ref 0–0)
Platelets: 144 10*3/uL — ABNORMAL LOW (ref 150–450)
RBC: 2.83 x10E6/uL — ABNORMAL LOW (ref 3.77–5.28)
RDW: 16.3 % — ABNORMAL HIGH (ref 11.7–15.4)
WBC: 3.4 10*3/uL (ref 3.4–10.8)

## 2021-10-27 LAB — TSH+FREE T4
Free T4: 1.13 ng/dL (ref 0.82–1.77)
TSH: 3.1 u[IU]/mL (ref 0.450–4.500)

## 2021-10-29 DIAGNOSIS — R634 Abnormal weight loss: Secondary | ICD-10-CM | POA: Insufficient documentation

## 2021-10-29 NOTE — Assessment & Plan Note (Addendum)
Offered Naltrexone however pt declined at this time. She wanted to speak with a therapist first. PHQ 9 score 7.  AST/ALT previously elevated. Hep C normal in June 2022.  - follow up RUQ [place]

## 2021-10-29 NOTE — Assessment & Plan Note (Addendum)
Pt is a 65-yo-female who is not up-to-date with her cancer screenings. On chart review, previous weights 92-97 lbs in June and Feb 2022. PAP in March 2022 was HPV and intraepithelial lesion negative.  Discussed obtaining mammogram, colonoscopy and low-dose CT patient agreeable. Sister will ensure pt has a ride to her appointments.

## 2021-10-29 NOTE — Assessment & Plan Note (Signed)
Obtain low-dose CT.

## 2021-10-29 NOTE — Assessment & Plan Note (Signed)
DEXA Scan ordered. Pt to set up with breast imaging.

## 2021-11-18 ENCOUNTER — Telehealth: Payer: Self-pay

## 2021-11-18 NOTE — Telephone Encounter (Signed)
Patients sister calls nurse line wanting to inform PCP of the death of 59 of her grandchildren. At this time the patient is doing "ok" and will reach out for an apt if she feels she needs to speak with someone.

## 2021-11-19 NOTE — Telephone Encounter (Signed)
Called patient to check on her after hearing of the deaths of her 3 grandchildren.  She states she is ok. She is near family at this difficult time. She thanked me for the call and will let me know if there is something I can do for her.   Lyndee Hensen, DO

## 2021-12-11 ENCOUNTER — Inpatient Hospital Stay: Admission: RE | Admit: 2021-12-11 | Payer: Medicare HMO | Source: Ambulatory Visit

## 2021-12-16 ENCOUNTER — Ambulatory Visit: Payer: Medicare HMO

## 2021-12-16 ENCOUNTER — Other Ambulatory Visit: Payer: Medicare HMO

## 2021-12-22 ENCOUNTER — Other Ambulatory Visit: Payer: Self-pay | Admitting: Family Medicine

## 2021-12-22 DIAGNOSIS — Z1231 Encounter for screening mammogram for malignant neoplasm of breast: Secondary | ICD-10-CM

## 2022-01-04 ENCOUNTER — Other Ambulatory Visit: Payer: Self-pay

## 2022-01-04 ENCOUNTER — Ambulatory Visit (INDEPENDENT_AMBULATORY_CARE_PROVIDER_SITE_OTHER): Payer: Medicare HMO | Admitting: Family Medicine

## 2022-01-04 ENCOUNTER — Other Ambulatory Visit: Payer: Self-pay | Admitting: Family Medicine

## 2022-01-04 ENCOUNTER — Encounter: Payer: Self-pay | Admitting: Family Medicine

## 2022-01-04 VITALS — BP 124/78 | HR 80 | Wt 90.0 lb

## 2022-01-04 DIAGNOSIS — R748 Abnormal levels of other serum enzymes: Secondary | ICD-10-CM | POA: Diagnosis not present

## 2022-01-04 DIAGNOSIS — R110 Nausea: Secondary | ICD-10-CM | POA: Diagnosis not present

## 2022-01-04 DIAGNOSIS — F1010 Alcohol abuse, uncomplicated: Secondary | ICD-10-CM | POA: Diagnosis not present

## 2022-01-04 DIAGNOSIS — D649 Anemia, unspecified: Secondary | ICD-10-CM | POA: Diagnosis not present

## 2022-01-04 DIAGNOSIS — R718 Other abnormality of red blood cells: Secondary | ICD-10-CM | POA: Diagnosis not present

## 2022-01-04 DIAGNOSIS — R000 Tachycardia, unspecified: Secondary | ICD-10-CM

## 2022-01-04 DIAGNOSIS — R4586 Emotional lability: Secondary | ICD-10-CM

## 2022-01-04 DIAGNOSIS — D696 Thrombocytopenia, unspecified: Secondary | ICD-10-CM | POA: Diagnosis not present

## 2022-01-04 DIAGNOSIS — Z833 Family history of diabetes mellitus: Secondary | ICD-10-CM

## 2022-01-04 DIAGNOSIS — Z780 Asymptomatic menopausal state: Secondary | ICD-10-CM

## 2022-01-04 DIAGNOSIS — R634 Abnormal weight loss: Secondary | ICD-10-CM | POA: Diagnosis not present

## 2022-01-04 DIAGNOSIS — R1110 Vomiting, unspecified: Secondary | ICD-10-CM

## 2022-01-04 MED ORDER — ONDANSETRON 4 MG PO TBDP: 4.0000 mg | ORAL_TABLET | Freq: Once | ORAL | Status: DC

## 2022-01-04 MED ORDER — ONDANSETRON HCL 4 MG PO TABS: 4.0000 mg | ORAL_TABLET | Freq: Three times a day (TID) | ORAL | 0 refills | Status: DC | PRN
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
## 2022-01-04 NOTE — Patient Instructions (Addendum)
Call [facility] to schedule a colonoscopy  Address: [address], [place], [place] [address] Phone: [phone]  Call to schedule your mammogram  [facility] [address], [place], [place] [address] Phone: [phone]  Call [facility] for therapy (they take your insurance).  [facility]  [address], [place], [place] [address], [place] [email or web address] Phone: [phone]   Follow up with me in March 14th as scheduled.

## 2022-01-05 ENCOUNTER — Telehealth: Payer: Self-pay

## 2022-01-05 ENCOUNTER — Other Ambulatory Visit: Payer: Self-pay | Admitting: Family Medicine

## 2022-01-05 ENCOUNTER — Encounter: Payer: Self-pay | Admitting: Internal Medicine

## 2022-01-05 DIAGNOSIS — Z1231 Encounter for screening mammogram for malignant neoplasm of breast: Secondary | ICD-10-CM

## 2022-01-05 LAB — LIPID PANEL
Chol/HDL Ratio: 2 ratio (ref 0.0–4.4)
Cholesterol, Total: 172 mg/dL (ref 100–199)
HDL: 87 mg/dL (ref >39)
LDL Chol Calc (NIH): 65 mg/dL (ref 0–99)
Triglycerides: 117 mg/dL (ref 0–149)
VLDL Cholesterol Cal: 20 mg/dL (ref 5–40)

## 2022-01-05 LAB — CBC WITH DIFFERENTIAL/PLATELET
Basophils Absolute: 0 10*3/uL (ref 0.0–0.2)
Basos: 0 %
EOS (ABSOLUTE): 0 10*3/uL (ref 0.0–0.4)
Eos: 0 %
Hematocrit: 31.8 % — ABNORMAL LOW (ref 34.0–46.6)
Hemoglobin: 10.3 g/dL — ABNORMAL LOW (ref 11.1–15.9)
Immature Grans (Abs): 0 10*3/uL (ref 0.0–0.1)
Immature Granulocytes: 0 %
Lymphocytes Absolute: 1.6 10*3/uL (ref 0.7–3.1)
Lymphs: 36 %
MCH: 30.9 pg (ref 26.6–33.0)
MCHC: 32.4 g/dL (ref 31.5–35.7)
MCV: 96 fL (ref 79–97)
Monocytes Absolute: 0.6 10*3/uL (ref 0.1–0.9)
Monocytes: 13 %
NRBC: 2 % — ABNORMAL HIGH (ref 0–0)
Neutrophils Absolute: 2.3 10*3/uL (ref 1.4–7.0)
Neutrophils: 51 %
Platelets: 130 10*3/uL — ABNORMAL LOW (ref 150–450)
RBC: 3.33 x10E6/uL — ABNORMAL LOW (ref 3.77–5.28)
RDW: 16.1 % — ABNORMAL HIGH (ref 11.7–15.4)
WBC: 4.6 10*3/uL (ref 3.4–10.8)

## 2022-01-05 LAB — PROTIME-INR
INR: 1.1 (ref 0.9–1.2)
Prothrombin Time: 12 s (ref 9.1–12.0)

## 2022-01-05 LAB — COMPREHENSIVE METABOLIC PANEL
ALT: 51 IU/L — ABNORMAL HIGH (ref 0–32)
AST: 139 IU/L — ABNORMAL HIGH (ref 0–40)
Albumin/Globulin Ratio: 1.8 (ref 1.2–2.2)
Albumin: 4.2 g/dL (ref 3.8–4.8)
Alkaline Phosphatase: 142 IU/L — ABNORMAL HIGH (ref 44–121)
BUN/Creatinine Ratio: 13 (ref 12–28)
BUN: 7 mg/dL — ABNORMAL LOW (ref 8–27)
Bilirubin Total: 1 mg/dL (ref 0.0–1.2)
CO2: 25 mmol/L (ref 20–29)
Calcium: 8.9 mg/dL (ref 8.7–10.3)
Chloride: 99 mmol/L (ref 96–106)
Creatinine, Ser: 0.54 mg/dL — ABNORMAL LOW (ref 0.57–1.00)
Globulin, Total: 2.4 g/dL (ref 1.5–4.5)
Glucose: 134 mg/dL — ABNORMAL HIGH (ref 70–99)
Potassium: 3.3 mmol/L — ABNORMAL LOW (ref 3.5–5.2)
Sodium: 143 mmol/L (ref 134–144)
Total Protein: 6.6 g/dL (ref 6.0–8.5)
eGFR: 101 mL/min/{1.73_m2} (ref >59)

## 2022-01-05 LAB — HEMOGLOBIN A1C
Est. average glucose Bld gHb Est-mCnc: 85 mg/dL
Hgb A1c MFr Bld: 4.6 % — ABNORMAL LOW (ref 4.8–5.6)

## 2022-01-05 NOTE — Telephone Encounter (Signed)
Received phone call from Linde Gillis at [facility] regarding referral for therapist. They are needing clinical documentation sent to their office to get patient established.   Please advise once office visit note is completed from yesterday's visit. Will fax this over once completed.   Talbot Grumbling, RN

## 2022-01-07 ENCOUNTER — Encounter: Payer: Self-pay | Admitting: Family Medicine

## 2022-01-07 ENCOUNTER — Ambulatory Visit
Admission: RE | Admit: 2022-01-07 | Discharge: 2022-01-07 | Disposition: A | Discharge status: Home / Self Care | Payer: Medicare HMO | Source: Ambulatory Visit | Attending: Family Medicine | Admitting: Family Medicine

## 2022-01-07 DIAGNOSIS — D696 Thrombocytopenia, unspecified: Secondary | ICD-10-CM | POA: Insufficient documentation

## 2022-01-07 DIAGNOSIS — Z1231 Encounter for screening mammogram for malignant neoplasm of breast: Secondary | ICD-10-CM | POA: Diagnosis not present

## 2022-01-07 DIAGNOSIS — R4586 Emotional lability: Secondary | ICD-10-CM | POA: Insufficient documentation

## 2022-01-07 NOTE — Assessment & Plan Note (Addendum)
Chronic. Repeat CBC.  Consider blood smear. Suspect 2/2 liver dysregulation

## 2022-01-07 NOTE — Assessment & Plan Note (Addendum)
Continues to drink EtOH, reports decreased amount but family unsure of validity of her report.  Unable to quantify. LFTs elevated previously. Repeat CMP today. Offered Naltrexone but declined.   - Scheduled RUQ [place] while in office, sister to ensure pt arrives  -Follow-up INR, CMP

## 2022-01-07 NOTE — Assessment & Plan Note (Addendum)
Patient is a 66 year old female with unintended weight loss.  She has gained 2 pounds however since last visit in November.  She has been drinking a boost daily.  Scheduled her lung and breast cancer screenings and provided information to obtain colonoscopy.  Sister aware of appointments and will ensure that she arrives.  Screenings: - Pap: Up-to-date; March 2022 negative - Mammogram: Needs - Colonoscopy: Needs - Low-dose lung CT: Needs

## 2022-01-07 NOTE — Telephone Encounter (Signed)
Faxed demographics and clinical note to [facility].   Talbot Grumbling, RN
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
## 2022-01-07 NOTE — Assessment & Plan Note (Addendum)
Acute nonbloody nonbilious emesis in the office.  Given Zofran ODT in office.  Prescribed Zofran.  Obtain CBC, CMP, A1C.  Sitter acute hepatitis, alcohol use, gastritis, pancreatitis.  Exam not concerning for gallbladder pathology

## 2022-01-12 ENCOUNTER — Ambulatory Visit
Admission: RE | Admit: 2022-01-12 | Discharge: 2022-01-12 | Disposition: A | Discharge status: Home / Self Care | Payer: Medicare HMO | Source: Ambulatory Visit | Attending: Family Medicine | Admitting: Family Medicine

## 2022-01-12 ENCOUNTER — Other Ambulatory Visit: Payer: Self-pay

## 2022-01-12 DIAGNOSIS — K760 Fatty (change of) liver, not elsewhere classified: Secondary | ICD-10-CM | POA: Diagnosis not present

## 2022-01-12 DIAGNOSIS — R634 Abnormal weight loss: Secondary | ICD-10-CM

## 2022-01-12 DIAGNOSIS — F1010 Alcohol abuse, uncomplicated: Secondary | ICD-10-CM

## 2022-01-13 ENCOUNTER — Other Ambulatory Visit: Payer: Self-pay | Admitting: Family Medicine

## 2022-01-13 DIAGNOSIS — R9389 Abnormal findings on diagnostic imaging of other specified body structures: Secondary | ICD-10-CM

## 2022-01-13 DIAGNOSIS — R1900 Intra-abdominal and pelvic swelling, mass and lump, unspecified site: Secondary | ICD-10-CM

## 2022-01-13 DIAGNOSIS — K828 Other specified diseases of gallbladder: Secondary | ICD-10-CM

## 2022-01-14 ENCOUNTER — Other Ambulatory Visit: Payer: Self-pay

## 2022-01-14 ENCOUNTER — Ambulatory Visit
Admission: RE | Admit: 2022-01-14 | Discharge: 2022-01-14 | Disposition: A | Discharge status: Home / Self Care | Payer: Medicare HMO | Source: Ambulatory Visit | Attending: Family Medicine | Admitting: Family Medicine

## 2022-01-14 ENCOUNTER — Other Ambulatory Visit: Payer: Medicare HMO

## 2022-01-14 DIAGNOSIS — M85852 Other specified disorders of bone density and structure, left thigh: Secondary | ICD-10-CM | POA: Diagnosis not present

## 2022-01-14 DIAGNOSIS — Z780 Asymptomatic menopausal state: Secondary | ICD-10-CM | POA: Diagnosis not present

## 2022-01-14 NOTE — Progress Notes (Signed)
Spoke with patient informed her of her appt at [facility] on 3/7 at 4:10 315 location . Patient is aware that she is not to eat or drink 4 hours before appt time. Salvatore Marvel, CMA

## 2022-01-21 ENCOUNTER — Telehealth: Payer: Self-pay | Admitting: *Deleted

## 2022-01-21 NOTE — Telephone Encounter (Signed)
While getting pt's chart ready for her PV, noted she was recently seen by PCP for weight loss, nausea, and anemia.  She was scheduled as screening colonoscopy only.  I spoke with her sister, who makes her appointments.  I explained an OV would be appropriate prior to procedure d/t pt's issues.  She agrees and OV made with Colleen on 02-11-22 at 2:30 pm.  PV for 02-08-22 cancelled and sister is aware.  Colonoscopy appt kept for now (02-22-22) but appt note made to cancel if needed.

## 2022-01-22 ENCOUNTER — Ambulatory Visit: Payer: Medicare HMO

## 2022-01-27 ENCOUNTER — Other Ambulatory Visit: Payer: Medicare HMO

## 2022-01-29 ENCOUNTER — Ambulatory Visit
Admission: RE | Admit: 2022-01-29 | Discharge: 2022-01-29 | Disposition: A | Discharge status: Home / Self Care | Payer: Medicare HMO | Source: Ambulatory Visit | Attending: Family Medicine | Admitting: Family Medicine

## 2022-01-29 DIAGNOSIS — R634 Abnormal weight loss: Secondary | ICD-10-CM

## 2022-01-29 DIAGNOSIS — Z720 Tobacco use: Secondary | ICD-10-CM

## 2022-01-29 DIAGNOSIS — F17210 Nicotine dependence, cigarettes, uncomplicated: Secondary | ICD-10-CM | POA: Diagnosis not present

## 2022-02-09 ENCOUNTER — Ambulatory Visit
Admission: RE | Admit: 2022-02-09 | Discharge: 2022-02-09 | Disposition: A | Discharge status: Home / Self Care | Payer: Medicare HMO | Source: Ambulatory Visit | Attending: Family Medicine | Admitting: Family Medicine

## 2022-02-09 DIAGNOSIS — K828 Other specified diseases of gallbladder: Secondary | ICD-10-CM | POA: Diagnosis not present

## 2022-02-09 DIAGNOSIS — R9389 Abnormal findings on diagnostic imaging of other specified body structures: Secondary | ICD-10-CM

## 2022-02-09 DIAGNOSIS — R1900 Intra-abdominal and pelvic swelling, mass and lump, unspecified site: Secondary | ICD-10-CM

## 2022-02-09 MED ORDER — GADOBUTROL 1 MMOL/ML IV SOLN
5.0000 mL | Freq: Once | INTRAVENOUS | Status: AC | PRN
  Administered 2022-02-09: 5 mL via INTRAVENOUS

## 2022-02-11 ENCOUNTER — Ambulatory Visit: Payer: Medicare HMO | Admitting: Nurse Practitioner

## 2022-02-11 ENCOUNTER — Other Ambulatory Visit (INDEPENDENT_AMBULATORY_CARE_PROVIDER_SITE_OTHER): Payer: Medicare HMO

## 2022-02-11 ENCOUNTER — Encounter: Payer: Self-pay | Admitting: Nurse Practitioner

## 2022-02-11 VITALS — BP 104/60 | HR 76 | Ht 65.0 in | Wt 90.6 lb

## 2022-02-11 DIAGNOSIS — D649 Anemia, unspecified: Secondary | ICD-10-CM

## 2022-02-11 DIAGNOSIS — F1010 Alcohol abuse, uncomplicated: Secondary | ICD-10-CM

## 2022-02-11 DIAGNOSIS — R7989 Other specified abnormal findings of blood chemistry: Secondary | ICD-10-CM

## 2022-02-11 DIAGNOSIS — Z1211 Encounter for screening for malignant neoplasm of colon: Secondary | ICD-10-CM | POA: Diagnosis not present

## 2022-02-11 DIAGNOSIS — R935 Abnormal findings on diagnostic imaging of other abdominal regions, including retroperitoneum: Secondary | ICD-10-CM

## 2022-02-11 DIAGNOSIS — R112 Nausea with vomiting, unspecified: Secondary | ICD-10-CM

## 2022-02-11 DIAGNOSIS — K828 Other specified diseases of gallbladder: Secondary | ICD-10-CM

## 2022-02-11 LAB — CBC
HCT: 31.6 % — ABNORMAL LOW (ref 36.0–46.0)
Hemoglobin: 10.1 g/dL — ABNORMAL LOW (ref 12.0–15.0)
MCHC: 31.8 g/dL (ref 30.0–36.0)
MCV: 98.8 fl (ref 78.0–100.0)
Platelets: 118 10*3/uL — ABNORMAL LOW (ref 150.0–400.0)
RBC: 3.2 Mil/uL — ABNORMAL LOW (ref 3.87–5.11)
RDW: 16.4 % — ABNORMAL HIGH (ref 11.5–15.5)
WBC: 3.4 10*3/uL — ABNORMAL LOW (ref 4.0–10.5)

## 2022-02-11 LAB — HEPATIC FUNCTION PANEL
ALT: 82 U/L — ABNORMAL HIGH (ref 0–35)
AST: 287 U/L — ABNORMAL HIGH (ref 0–37)
Albumin: 4 g/dL (ref 3.5–5.2)
Alkaline Phosphatase: 119 U/L — ABNORMAL HIGH (ref 39–117)
Bilirubin, Direct: 0.5 mg/dL — ABNORMAL HIGH (ref 0.0–0.3)
Total Bilirubin: 1.4 mg/dL — ABNORMAL HIGH (ref 0.2–1.2)
Total Protein: 6.7 g/dL (ref 6.0–8.3)

## 2022-02-11 LAB — BASIC METABOLIC PANEL
BUN: 7 mg/dL (ref 6–23)
CO2: 26 mEq/L (ref 19–32)
Calcium: 8.8 mg/dL (ref 8.4–10.5)
Chloride: 101 mEq/L (ref 96–112)
Creatinine, Ser: 0.58 mg/dL (ref 0.40–1.20)
GFR: 94.18 mL/min (ref >60.00)
Glucose, Bld: 94 mg/dL (ref 70–99)
Potassium: 3.5 mEq/L (ref 3.5–5.1)
Sodium: 140 mEq/L (ref 135–145)

## 2022-02-11 LAB — LIPASE: Lipase: 21 U/L (ref 11.0–59.0)

## 2022-02-11 MED ORDER — CLENPIQ 10-3.5-12 MG-GM -GM/160ML PO SOLN: ORAL | 0 refills | Status: DC

## 2022-02-11 NOTE — Progress Notes (Signed)
02/11/2022 Leah Woodward [id number] Apr 04, 1956   CHIEF COMPLAINT: Schedule colonoscopy, nausea/vomiting.  HISTORY OF PRESENT ILLNESS: Leah Woodward is a 66 year old female with a past medical history of depression, alcohol use disorder and elevated LFTs.  She was referred to our office today as referred by Dr.Todd McDiarmid to schedule a colonoscopy.  She is accompanied by her sister.  She is a challenging historian.  She is passing normal formed brown bowel movement daily.  No rectal bleeding or black stools.  No lower abdominal pain.  No known family history of colorectal cancer.  She has nausea and started vomiting intermittently 2 weeks ago.  She describes emesis as clear water or partially digested food.  No coffee-ground emesis or frank hematemesis.  No specific food triggers.  She denies having any dysphagia or heartburn.  History of alcohol use disorder.  She previously drank 1 pint of liquor every 2 to 3 days.  She recently decreased her alcohol intake, 1 month ago she was drinking 3 shots daily and over the past few weeks further reduce her alcohol intake and is drinking 2 shots of liquor every other day.  No drug use.  She smokes cigarettes 1/2 ppd x 40 years.  Her most recent laboratory studies done 01/04/2022 showed she has anemia, thrombocytopenia and persistent elevated LFTs for the past year.  Hemoglobin level 10.3.  Platelet 130.  Alk phos 142.  AST 139.  ALT 51. She denies having any recent significant weight loss.  Her weight was 96 pounds on 01/23/2021.  Her weight today is 90 pounds.  No fever, sweats or chills.  She lives by herself.  She eats one meal daily.  In review of her epic records, she underwent RUQ sonogram 01/12/2022 which identified a nonmobile soft tissue mass noted in the fundus of the gallbladder and hepatic steatosis was identified without evidence of gallstones or biliary distention.   An abdominal MRI with and without contrast was ordered by Dr.  McDiarmid was completed 02/09/2022 which identified tumefactive sludge in the gallbladder without evidence of acute cholecystitis or biliary ductal dilatation and iron deposition in the liver, spleen consistent with secondary hemochromatosis.  Note, the patient was not yet informed of her MRI results as a report was completed 1 day ago.  A chest CT scan 01/29/2022 showed evidence of mild centrilobular emphysema with diffuse bronchial wall thickening, lung RADS 3, a dominant 7.8 mm solid anterior right lower lobe pulmonary nodule.  CBC Latest Ref Rng & Units 01/04/2022 10/26/2021 12/09/2020  WBC 3.4 - 10.8 x10E3/uL 4.6 3.4 3.2(L)  Hemoglobin 11.1 - 15.9 g/dL 10.3(L) 8.8(L) 11.7  Hematocrit 34.0 - 46.6 % 31.8(L) 26.1(L) 35.2  Platelets 150 - 450 x10E3/uL 130(L) 144(L) 154    CMP Latest Ref Rng & Units 01/04/2022 10/26/2021 05/12/2021  Glucose 70 - 99 mg/dL 134(H) 160(H) 113(H)  BUN 8 - 27 mg/dL 7(L) 8 6(L)  Creatinine 0.57 - 1.00 mg/dL 0.54(L) 0.58 0.56(L)  Sodium 134 - 144 mmol/L 143 145(H) 144  Potassium 3.5 - 5.2 mmol/L 3.3(L) 3.8 4.1  Chloride 96 - 106 mmol/L 99 100 103  CO2 20 - 29 mmol/L 25 26 22   Calcium 8.7 - 10.3 mg/dL 8.9 8.9 9.5  Total Protein 6.0 - 8.5 g/dL 6.6 5.9(L) 6.8  Total Bilirubin 0.0 - 1.2 mg/dL 1.0 1.3(H) 0.9  Alkaline Phos 44 - 121 IU/L 142(H) 149(H) 115  AST 0 - 40 IU/L 139(H) 315(H) 119(H)  ALT 0 -  32 IU/L 51(H) 95(H) 49(H)    Labs 05/12/2021: Hepatitis A IgM negative.  Hepatitis B surface antigen negative.  Hepatitis B core IgM negative. Labs 12/09/2020: Hepatitis A total antibody positive.  Hepatitis C antibody < 0.1.  INR 1.1.  RUQ sonogram 01/12/2022: 1. A non mobile soft tissue mass is noted in the fundus of the gallbladder. Tiny punctate calcifications may be present within the mass. This mass may represent non mobile tumefactive sludge or a gallbladder tumor. Further evaluation of the abdomen can be obtained with MRI. No gallstones or biliary distention noted.   2.  Dense heterogeneous hepatic parenchymal pattern consistent with fatty infiltration and or hepatocellular disease. No focal hepatic abnormality identified.  Chest CT with contrast 01/29/2022: 1. Lung-RADS 3, probably benign findings. Short-term follow-up in 6 months is recommended with repeat low-dose chest CT without contrast (please use the following order, "CT CHEST LCS NODULE FOLLOW-UP W/O CM"). Dominant 7.8 mm solid anterior right lower lobe pulmonary nodule along the major fissure. 2. One vessel coronary atherosclerosis. 3. Aortic Atherosclerosis and Emphysema   Abdominal MRI with and without contrast 02/09/2022:  TECHNIQUE: Multiplanar multisequence MR imaging of the abdomen was performed both before and after the administration of intravenous contrast.   CONTRAST:  60m GADAVIST GADOBUTROL 1 MMOL/ML IV SOLN   COMPARISON:  Ultrasound January 12, 2022   FINDINGS: Lower chest: No acute abnormality.   Hepatobiliary: Hepatic parenchyma demonstrates intrinsic T2 signal less than that of paraspinal musculature, consistent with iron deposition. No suspicious hepatic lesion.   T2 hypointense masslike focus layering in the dependent gallbladder measuring 2.1 x 1.0 x 0.7 cm on images 17/3 and 20/5, this is homogeneously T1 hyperintense without evidence of postcontrast enhancement on subtraction imaging and no reduced diffusivity, consistent with tumefactive sludge. No evidence of acute cholecystitis.   No biliary ductal dilation.   Pancreas: Intrinsic T1 signal of the pancreatic parenchyma is within normal limits. No pancreatic ductal dilation. No cystic or solid hyperenhancing pancreatic lesion identified.   Spleen: Hypointense T2 signal less than that of background liver and paraspinal musculature consistent with iron deposition.   Adrenals/Urinary Tract: Bilateral adrenal glands appear normal. No hydronephrosis. No solid enhancing renal mass.   Stomach/Bowel: Visualized  portions within the abdomen are unremarkable.   Vascular/Lymphatic: No pathologically enlarged lymph nodes identified. No abdominal aortic aneurysm demonstrated.   Other:  No significant abdominal free fluid.   Musculoskeletal: No suspicious bone lesions identified. Lumbar perineural root sleeve cysts.   IMPRESSION: 1. Tumefactive sludge in the gallbladder corresponding with the abnormality seen on prior ultrasound. No evidence of acute cholecystitis or biliary ductal dilation. 2. Iron deposition in the liver and spleen, consistent with secondary hemochromatosis.   Social History: She drinks 2 shots of liquor every other day. One month ago 3 shots daily. 20 years. No drug use. She smokes 3 cigarettes 20 years. 1/2 ppd.   Family History: Mother had lymphoma. No family history of esophageal, gastric, colon, pancreatic or liver cancer.  No Known Allergies    Outpatient Encounter Medications as of 02/11/2022  Medication Sig   albuterol (VENTOLIN HFA) 108 (90 Base) MCG/ACT inhaler Inhale 2 puffs into the lungs every 4 (four) hours as needed for wheezing or shortness of breath.   melatonin 5 MG TABS Take 1-2 tablets (5-10 mg total) by mouth at bedtime as needed.   ondansetron (ZOFRAN) 4 MG tablet Take 1 tablet (4 mg total) by mouth every 8 (eight) hours as needed for nausea or vomiting.  Facility-Administered Encounter Medications as of 02/11/2022  Medication   ondansetron (ZOFRAN-ODT) disintegrating tablet 4 mg    REVIEW OF SYSTEMS:  Gen: + Weight loss.  No fever, sweats or chills. CV: Denies chest pain, palpitations or edema. Resp: + Chronic cough.  No shortness of breath or hemoptysis. GI: See HPI. GU : Denies urinary burning, blood in urine, increased urinary frequency or incontinence. MS: + Generalized weakness.  Derm: Denies rash, itchiness, skin lesions or unhealing ulcers. Psych: + Depression.  Heme: Denies bruising, easy bleeding. Neuro:  Denies headaches, dizziness or  paresthesias. Endo:  Denies any problems with DM, thyroid or adrenal function.  PHYSICAL EXAM: BP 104/60    Pulse 76    Ht 5' 5"  (1.651 m)    Wt 90 lb 9.6 oz (41.1 kg)    BMI 15.08 kg/m  Wt Readings from Last 3 Encounters:  02/11/22 90 lb 9.6 oz (41.1 kg)  01/04/22 90 lb (40.8 kg)  10/26/21 88 lb 8 oz (40.1 kg)  01/23/2021     96lbs  General: Thin 66 year old female in no acute distress. Head: Normocephalic and atraumatic. Eyes: Faint scleral icterus.  Conjunctive pink. Ears: Normal auditory acuity. Mouth: Dentition intact. No ulcers or lesions.  Neck: Supple, no lymphadenopathy or thyromegaly.  Lungs: Clear bilaterally to auscultation without wheezes, crackles or rhonchi. Heart: Regular rate and rhythm. No murmur, rub or gallop appreciated.  Abdomen: Soft, nontender, non distended. No masses. No hepatosplenomegaly. Normoactive bowel sounds x 4 quadrants.  Rectal: Deferred. Musculoskeletal: Symmetrical with no gross deformities. Skin: Warm and dry. No rash or lesions on visible extremities. Extremities: No edema. Neurological: Alert oriented x 4, no focal deficits.  Psychological:  Alert and cooperative. Normal mood and affect.  ASSESSMENT AND PLAN:  19) 66 year old female who presents today to schedule a screening colonoscopy.  No known family history of colon cancer.  She denies ever having a screening colonoscopy. -Colonoscopy benefits and risks discussed including risk with sedation, risk of bleeding, perforation and infection   2) Normocytic anemia.  No overt GI bleeding. -CBC, iron, iron saturation, TIBC and ferritin level  3) Elevated LFTs.  AST/ALT ratio consistent with alcohol associated liver disease.  AST 139.  ALT 51.  Alk phos 142.  Total bili 1.0.  Hepatitis A total antibody positive.  Hepatitis B surface antigen negative.  RUQ sonogram 01/12/2022 showed evidence of a soft tissue mass to the gallbladder fundus and fatty liver.  An abdominal MRI 02/09/2022 showed evidence  of iron deposition to liver suggestive of secondary hemochromatosis without any concerning liver mass or lesions. -Hepatic panel, Hepatitis B core total antibody, hepatitis B surface antibody, ceruloplasmin, ANA, SMA, AMA, IgG, alpha 1 antitrypsin level, iron and ferritin levels.  -I will send additional labs for hemochromatosis mutations if iron and ferritin levels elevated -Liver biopsy deferred for now  4) N/V, upper GI versus gallbladder etiology. Gallbladder nonmobile soft tissue mass per RUQ sonogram 01/2022.  Abdominal MRI with and without contrast 02/09/2022 identified a 2.1 x 1.0 x 0.7 masslike focus layering in the dependent gallbladder consistent with tumefactive sludge without evidence of acute cholecystitis. -EGD benefits and risks discussed including risk with sedation, risk of bleeding, perforation and infection  -Dr. Candis Schatz to review RUQ sonogram and MRI results, consider general surgery consult for cholecystectomy +/- intraoperative liver biopsy -Continue Zofran as needed 4 mg p.o. every 8 hours as needed -Lipase level  5) Alcohol use disorder -Recommended complete alcohol cessation  6) Chronic tobacco use. Chest  CT 01/2022 identified a 7.8 mm solid anterior right lower lobe pulmonary nodule. -Recommended smoking cessation -Further management and follow-up chest CT per PCP  7) Weight loss, multifactorial: Poor nutrition in setting of alcohol use disorder with N/V, significance of gallbladder mass is unclear, malignancy cannot be excluded at this time. -EGD and colonoscopy to rule out upper and lower GI malignancy -Increase boost to 2 cans daily. -See plan in #4  8) Thrombocytopenia, likely secondary to alcohol associated bone marrow suppression.  Abdominal MRI without discrete evidence of cirrhosis and no evidence of splenomegaly.    CC:  Lyndee Hensen, DO

## 2022-02-11 NOTE — Patient Instructions (Addendum)
You have been scheduled for an EGD and Colonoscopy. Please follow the written instructions given to you at your visit today. ?Please pick up your prep supplies at the pharmacy within the next 1-3 days. ?If you use inhalers (even only as needed), please bring them with you on the day of your procedure. ? ?Please proceed to the basement level for lab work before leaving today. Press "B" on the elevator. The lab is located at the first door on the left as you exit the elevator. ? ?Follow up with your primary care provider regarding the MRI you had. ? ?Thank you for trusting me with your gastrointestinal care!   ? ?Noralyn Pick, CRNP ? ? ? ?BMI: ? ?If you are age 22 or older, your body mass index should be between 23-30. Your Body mass index is 15.08 kg/m?Marland Kitchen If this is out of the aforementioned range listed, please consider follow up with your Primary Care Provider. ? ?If you are age 25 or younger, your body mass index should be between 19-25. Your Body mass index is 15.08 kg/m?Marland Kitchen If this is out of the aformentioned range listed, please consider follow up with your Primary Care Provider.  ? ?MY CHART: ? ?The [facility] providers would like to encourage you to use [facility] to communicate with providers for non-urgent requests or questions.  Due to long hold times on the telephone, sending your provider a message by [facility] may be a faster and more efficient way to get a response.  Please allow 48 business hours for a response.  Please remember that this is for non-urgent requests.  ? ?

## 2022-02-12 ENCOUNTER — Other Ambulatory Visit: Payer: Medicare HMO

## 2022-02-12 ENCOUNTER — Other Ambulatory Visit: Payer: Self-pay

## 2022-02-12 DIAGNOSIS — Z1211 Encounter for screening for malignant neoplasm of colon: Secondary | ICD-10-CM | POA: Diagnosis not present

## 2022-02-12 DIAGNOSIS — F1010 Alcohol abuse, uncomplicated: Secondary | ICD-10-CM

## 2022-02-12 DIAGNOSIS — R112 Nausea with vomiting, unspecified: Secondary | ICD-10-CM | POA: Diagnosis not present

## 2022-02-12 DIAGNOSIS — R7989 Other specified abnormal findings of blood chemistry: Secondary | ICD-10-CM | POA: Diagnosis not present

## 2022-02-12 DIAGNOSIS — D649 Anemia, unspecified: Secondary | ICD-10-CM | POA: Diagnosis not present

## 2022-02-12 NOTE — Addendum Note (Signed)
Addended by: Hardie Pulley, Gael Delude J on: 02/12/2022 10:19 AM ? ? Modules accepted: Orders ? ?

## 2022-02-13 ENCOUNTER — Other Ambulatory Visit: Payer: Self-pay | Admitting: Family Medicine

## 2022-02-13 DIAGNOSIS — R911 Solitary pulmonary nodule: Secondary | ICD-10-CM

## 2022-02-13 LAB — IGG: IgG (Immunoglobin G), Serum: 939 mg/dL (ref 600–1540)

## 2022-02-16 ENCOUNTER — Ambulatory Visit (INDEPENDENT_AMBULATORY_CARE_PROVIDER_SITE_OTHER): Payer: Medicare HMO | Admitting: Family Medicine

## 2022-02-16 ENCOUNTER — Other Ambulatory Visit: Payer: Medicare HMO

## 2022-02-16 ENCOUNTER — Encounter: Payer: Self-pay | Admitting: Family Medicine

## 2022-02-16 ENCOUNTER — Other Ambulatory Visit: Payer: Self-pay

## 2022-02-16 DIAGNOSIS — F1010 Alcohol abuse, uncomplicated: Secondary | ICD-10-CM

## 2022-02-16 DIAGNOSIS — Z832 Family history of diseases of the blood and blood-forming organs and certain disorders involving the immune mechanism: Secondary | ICD-10-CM | POA: Diagnosis not present

## 2022-02-16 DIAGNOSIS — D649 Anemia, unspecified: Secondary | ICD-10-CM

## 2022-02-16 DIAGNOSIS — R634 Abnormal weight loss: Secondary | ICD-10-CM | POA: Diagnosis not present

## 2022-02-16 LAB — ALPHA-1-ANTITRYPSIN: A-1 Antitrypsin, Ser: 139 mg/dL (ref 83–199)

## 2022-02-16 LAB — IRON,TIBC AND FERRITIN PANEL
%SAT: 96 % (calc) — ABNORMAL HIGH (ref 16–45)
Ferritin: 2655 ng/mL — ABNORMAL HIGH (ref 16–288)
Iron: 272 ug/dL — ABNORMAL HIGH (ref 45–160)
TIBC: 282 mcg/dL (calc) (ref 250–450)

## 2022-02-16 LAB — CERULOPLASMIN: Ceruloplasmin: 24 mg/dL (ref 18–53)

## 2022-02-16 LAB — ANA: Anti Nuclear Antibody (ANA): NEGATIVE

## 2022-02-16 LAB — MITOCHONDRIAL ANTIBODIES: Mitochondrial M2 Ab, IgG: <=20 U (ref <=20.0)

## 2022-02-16 LAB — HEPATITIS B CORE ANTIBODY, TOTAL: Hep B Core Total Ab: NONREACTIVE

## 2022-02-16 LAB — HEPATITIS B SURFACE ANTIBODY,QUALITATIVE: Hep B S Ab: NONREACTIVE

## 2022-02-16 LAB — ANTI-SMOOTH MUSCLE ANTIBODY, IGG: Actin (Smooth Muscle) Antibody (IGG): 21 U — ABNORMAL HIGH (ref <20)

## 2022-02-16 NOTE — Patient Instructions (Signed)
It was great seeing you today! ? ?Please check-out at the front desk before leaving the clinic. I'd like to see you back in 03/22/22 but if you need to be seen earlier than that for any new issues we're happy to fit you in, just give [place] a call! ? ?Visit Remembers: ?- A referral to hematologist was placed be call us in 2 weeks if you have not been contacted about an appointment. ? ?Please bring all of your medications with you to each visit.  ? ?Feel free to call with any questions or concerns at any time, at [phone]. ?  ?Take care,  ?Dr. Susa Simmonds ?[place]  ?

## 2022-02-16 NOTE — Assessment & Plan Note (Signed)
Continues to drink alcohol.  Reports cutting back.  Discussed starting naltrexone but patient declined.  Discussed effects of alcohol on her damaged liver.  AST and ALT remain elevated on her most recent blood work.  Continue to encourage alcohol cessation. ?

## 2022-02-16 NOTE — Assessment & Plan Note (Signed)
Recent MRI abdomen concerning for secondary hemochromatosis.  In speaking with the sister, there is a family history of thalassemia/sickle cell and their mom.  Patient's mom required a splenectomy.  Reports the family had to be tested for something but unsure what to test was for.  Discussed referral to hematology and patient was agreeable.  Referral placed. ?

## 2022-02-16 NOTE — Progress Notes (Signed)
? ?  SUBJECTIVE:  ? ?CHIEF COMPLAINT / HPI:  ? ?Chief Complaint  ?Patient presents with  ? Follow-up  ?  Weight loss  ? ? ? ?Leah Woodward is a 66 y.o. female here for weight loss follow-up.  States that she has been drinking strawberry boost in the morning.  She tends to eat dinner regularly.  Will occasionally have a snack but is not a big snacker.  Feels like she gained a couple pounds.  Has seen the GI doctors and are planning to have an endoscopy next month. ? ?Continues to have intermittent nausea.  States that last week she was vomiting again.  She tried soup and ginger ale which rarely give her issues but this also caused her to vomit.  Denies blood in emesis.  Symptoms lasted about 2 days and has not come back since. ? ?Reports drinking only 3 days a week now.  On the days that she does drink, has 2-3 airplane bottles of liquor.  She is trying to cut down her intake. ? ? ?PERTINENT  PMH / PSH: reviewed and updated as appropriate  ? ?OBJECTIVE:  ? ?BP 110/60   Pulse (!) 111   Ht '5\' 5"'$  (1.651 m)   Wt 93 lb (42.2 kg)   SpO2 97%   BMI 15.48 kg/m?   ? ?GEN: pleasant thin female, in no acute distress  ?CV: regular rate and rhythm ?RESP: no increased work of breathing, clear to ascultation bilaterally ?ABD: Bowel sounds present. Soft, non-tender, non-distended, no Murphy sign, liver edge palpable, nonpalpable spleen ?SKIN: warm, dry ? ? ? ?ASSESSMENT/PLAN:  ? ?Alcohol abuse ?Continues to drink alcohol.  Reports cutting back.  Discussed starting naltrexone but patient declined.  Discussed effects of alcohol on her damaged liver.  AST and ALT remain elevated on her most recent blood work.  Continue to encourage alcohol cessation. ? ?Unintended weight loss ?Patient is a 66 year old female with unintended weight loss.  She has gained 3 pounds since her last visit.  She continues to drink boost daily.  Mammogram was unremarkable.  Gastroenterology planning EGD and colonoscopy.  CT chest for lung cancer  screening showed a solitary nodule.  She would need to repeat this in 6 months. ? ? ? ?Secondary hemochromatosis ?Recent MRI abdomen concerning for secondary hemochromatosis.  In speaking with the sister, there is a family history of thalassemia/sickle cell and their mom.  Patient's mom required a splenectomy.  Reports the family had to be tested for something but unsure what to test was for.  Discussed referral to hematology and patient was agreeable.  Referral placed. ? ? ? ?Lyndee Hensen, DO ?PGY-3, [facility] ?02/16/2022  ? ? ? ? ? ? ? ? ?

## 2022-02-16 NOTE — Assessment & Plan Note (Addendum)
Patient is a 66 year old female with unintended weight loss.  She has gained 3 pounds since her last visit.  She continues to drink boost daily.  Mammogram was unremarkable.  Gastroenterology planning EGD and colonoscopy.  CT chest for lung cancer screening showed a solitary nodule.  She would need to repeat this in 6 months. ? ? ?

## 2022-02-18 ENCOUNTER — Telehealth: Payer: Self-pay | Admitting: Physician Assistant

## 2022-02-18 NOTE — Telephone Encounter (Signed)
Scheduled appt per 3/14 referral. Pt is aware of appt date and time. Pt is aware to arrive 15 mins prior to appt time and to bring and updated insurance card. Pt is aware of appt location.   ?

## 2022-02-21 NOTE — Progress Notes (Signed)
Agree with the assessment and plan as outlined by Carl Best, NP.  ?Suspect alcohol as the primary cause of elevated LAEs and hyperferritinemia, but agree that HFE testing is reasonable.   ?Agree with surgery referral, although she may have advanced fibrosis/early cirrhosis based on her thrombocytopenia which would increase her perioperative risk.   ?Agree with liver biopsy to provide information on staging of fibrosis as well as to differentiate primary vs secondary iron overload ? ? ?Leah Woodward E. Candis Schatz, MD ?[facility] ? ?

## 2022-02-22 ENCOUNTER — Encounter: Payer: Medicare HMO | Admitting: Internal Medicine

## 2022-02-24 NOTE — Progress Notes (Signed)
REFERRAL: ? ?Referral, demographics, copy of insurance card and records faxed to [facility] with confirmation received. Pt is aware of referral scheduling process as she will receive a call from their office regarding the date, time and location of appointment.  ?

## 2022-02-24 NOTE — Addendum Note (Signed)
Addended by: Aleatha Borer J on: 02/24/2022 02:17 PM ? ? Modules accepted: Orders ? ?

## 2022-02-24 NOTE — Progress Notes (Signed)
Leah Woodward, refer to Dr. Dayle Points addendum. Complex situation.  ? ?Please enter a general referral consult to consider a cholecystectomy with a liver biopsy (elevated LFTs and iron levels) during this surgery if the surgeon assess as appropriate.  ? ?My lengthy new patient office consult note explains further.  ? ?Cholecystectomy due to abnormal MRI/MRCP which showed nonmobile gallbladder sludge.  ? ?Liver biopsy to rule out etiology for cirrhosis and to rule out hemochromatosis. ? ?Please contact with the recommendations to have the general surgery consult for the reasons listed above. ? ?She is also scheduled for an EGD and colonoscopy with Dr. Candis Schatz and she was referred to hem/onc.  There are a lot of moving parts regarding her evaluation.  Please let me know if you have any questions.  Thank you ?

## 2022-03-01 LAB — HEMOCHROMATOSIS DNA-PCR(C282Y,H63D)

## 2022-03-11 ENCOUNTER — Encounter: Payer: Self-pay | Admitting: Gastroenterology

## 2022-03-11 ENCOUNTER — Ambulatory Visit (AMBULATORY_SURGERY_CENTER): Payer: Medicare HMO | Admitting: Gastroenterology

## 2022-03-11 VITALS — BP 114/67 | HR 83 | Temp 97.8°F | Resp 17 | Ht 65.0 in | Wt 90.0 lb

## 2022-03-11 DIAGNOSIS — R112 Nausea with vomiting, unspecified: Secondary | ICD-10-CM

## 2022-03-11 DIAGNOSIS — R7989 Other specified abnormal findings of blood chemistry: Secondary | ICD-10-CM

## 2022-03-11 DIAGNOSIS — K31819 Angiodysplasia of stomach and duodenum without bleeding: Secondary | ICD-10-CM | POA: Diagnosis not present

## 2022-03-11 DIAGNOSIS — D649 Anemia, unspecified: Secondary | ICD-10-CM | POA: Diagnosis not present

## 2022-03-11 DIAGNOSIS — D128 Benign neoplasm of rectum: Secondary | ICD-10-CM | POA: Diagnosis not present

## 2022-03-11 DIAGNOSIS — D123 Benign neoplasm of transverse colon: Secondary | ICD-10-CM | POA: Diagnosis not present

## 2022-03-11 DIAGNOSIS — D124 Benign neoplasm of descending colon: Secondary | ICD-10-CM

## 2022-03-11 DIAGNOSIS — Z1211 Encounter for screening for malignant neoplasm of colon: Secondary | ICD-10-CM

## 2022-03-11 DIAGNOSIS — K635 Polyp of colon: Secondary | ICD-10-CM | POA: Diagnosis not present

## 2022-03-11 MED ORDER — SODIUM CHLORIDE 0.9 % IV SOLN: 500.0000 mL | Freq: Once | INTRAVENOUS | Status: DC

## 2022-03-11 NOTE — Op Note (Signed)
[place] ?Patient Name: Leah Woodward ?Procedure Date: 03/11/2022 2:05 PM ?MRN: [id number] ?Endoscopist: Payson Crumby E. Candis Schatz , MD ?Age: 66 ?Referring MD:  ?Date of Birth: 09-01-1956 ?Gender: Female ?Account #: [id number] ?Procedure:                Upper GI endoscopy ?Indications:              Nausea with vomiting, normocytic anemia ?Medicines:                Monitored Anesthesia Care ?Procedure:                Pre-Anesthesia Assessment: ?                          - Prior to the procedure, a History and Physical  ?                          was performed, and patient medications and  ?                          allergies were reviewed. The patient's tolerance of  ?                          previous anesthesia was also reviewed. The risks  ?                          and benefits of the procedure and the sedation  ?                          options and risks were discussed with the patient.  ?                          All questions were answered, and informed consent  ?                          was obtained. Prior Anticoagulants: The patient has  ?                          taken no previous anticoagulant or antiplatelet  ?                          agents. ASA Grade Assessment: III - A patient with  ?                          severe systemic disease. After reviewing the risks  ?                          and benefits, the patient was deemed in  ?                          satisfactory condition to undergo the procedure. ?                          After obtaining informed consent, the endoscope was  ?  passed under direct vision. Throughout the  ?                          procedure, the patient's blood pressure, pulse, and  ?                          oxygen saturations were monitored continuously. The  ?                          GIF HQ190 #[id number] was introduced through the  ?                          mouth, and advanced to the second part of duodenum.  ?                          The  upper GI endoscopy was accomplished without  ?                          difficulty. The patient tolerated the procedure  ?                          well. ?Scope In: ?Scope Out: ?Findings:                 The examined portions of the nasopharynx,  ?                          oropharynx and larynx were normal. ?                          The examined esophagus was normal. ?                          The gastroesophageal flap valve was visualized  ?                          endoscopically and classified as Hill Grade IV (no  ?                          fold, wide open lumen, hiatal hernia present). ?                          The entire examined stomach was normal. Biopsies  ?                          were taken with a cold forceps for Helicobacter  ?                          pylori testing. Estimated blood loss was minimal. ?                          The examined duodenum was normal. ?Complications:            No immediate complications. ?Estimated Blood Loss:     Estimated blood loss was minimal. ?Impression:               -  The examined portions of the nasopharynx,  ?                          oropharynx and larynx were normal. ?                          - Normal esophagus. ?                          - Gastroesophageal flap valve classified as Hill  ?                          Grade IV (no fold, wide open lumen, hiatal hernia  ?                          present). ?                          - Normal stomach. Biopsied. ?                          - Normal examined duodenum. ?Recommendation:           - Patient has a contact number available for  ?                          emergencies. The signs and symptoms of potential  ?                          delayed complications were discussed with the  ?                          patient. Return to normal activities tomorrow.  ?                          Written discharge instructions were provided to the  ?                          patient. ?                          - Resume previous  diet. ?                          - Continue present medications. ?                          - Await pathology results. ?Ishia Tenorio E. Candis Schatz, MD ?03/11/2022 3:15:00 PM ?This report has been signed electronically. ?

## 2022-03-11 NOTE — Progress Notes (Signed)
Called to room to assist during endoscopic procedure.  Patient ID and intended procedure confirmed with present staff. Received instructions for my participation in the procedure from the performing physician.  

## 2022-03-11 NOTE — Progress Notes (Signed)
History and Physical Interval Note: ? ?03/11/2022 ?2:04 PM ? ?Leah Woodward  has presented today for endoscopic procedure(s), with the diagnosis of No diagnosis found..  The various methods of evaluation and treatment have been discussed with the patient and/or family. After consideration of risks, benefits and other options for treatment, the patient has consented to  the endoscopic procedure(s). ? ? The patient's history has been reviewed, patient examined, no change in status, stable for endoscopic procedure(s).  I have reviewed the patient's chart and labs.  Questions were answered to the patient's satisfaction.   ? ? ?Maxamillion Banas E. Candis Schatz, MD ?[facility] ? ?

## 2022-03-11 NOTE — Progress Notes (Signed)
VS completed by DT.    Medical history reviewed and updated.  

## 2022-03-11 NOTE — Patient Instructions (Signed)
YOU HAD AN ENDOSCOPIC PROCEDURE TODAY: Refer to the procedure report and other information in the discharge instructions given to you for any specific questions about what was found during the examination. If this information does not answer your questions, please call [place] office at [phone] to clarify.  ° °YOU SHOULD EXPECT: Some feelings of bloating in the abdomen. Passage of more gas than usual. Walking can help get rid of the air that was put into your GI tract during the procedure and reduce the bloating. If you had a lower endoscopy (such as a colonoscopy or flexible sigmoidoscopy) you may notice spotting of blood in your stool or on the toilet paper. Some abdominal soreness may be present for a day or two, also. ° °DIET: Your first meal following the procedure should be a light meal and then it is ok to progress to your normal diet. A half-sandwich or bowl of soup is an example of a good first meal. Heavy or fried foods are harder to digest and may make you feel nauseous or bloated. Drink plenty of fluids but you should avoid alcoholic beverages for 24 hours. If you had a esophageal dilation, please see attached instructions for diet.   ° °ACTIVITY: Your care partner should take you home directly after the procedure. You should plan to take it easy, moving slowly for the rest of the day. You can resume normal activity the day after the procedure however YOU SHOULD NOT DRIVE, use power tools, machinery or perform tasks that involve climbing or major physical exertion for 24 hours (because of the sedation medicines used during the test).  ° °SYMPTOMS TO REPORT IMMEDIATELY: °A gastroenterologist can be reached at any hour. Please call [phone]  for any of the following symptoms:  °Following lower endoscopy (colonoscopy, flexible sigmoidoscopy) °Excessive amounts of blood in the stool  °Significant tenderness, worsening of abdominal pains  °Swelling of the abdomen that is new, acute  °Fever of 100° or  higher  °Following upper endoscopy (EGD, EUS, ERCP, esophageal dilation) °Vomiting of blood or coffee ground material  °New, significant abdominal pain  °New, significant chest pain or pain under the shoulder blades  °Painful or persistently difficult swallowing  °New shortness of breath  °Black, tarry-looking or red, bloody stools ° °FOLLOW UP:  °If any biopsies were taken you will be contacted by phone or by letter within the next 1-3 weeks. Call [phone]  if you have not heard about the biopsies in 3 weeks.  °Please also call with any specific questions about appointments or follow up tests. ° °

## 2022-03-11 NOTE — Progress Notes (Signed)
Pt non-responsive, VVS, Report to RN  °

## 2022-03-11 NOTE — Op Note (Addendum)
[place] ?Patient Name: Leah Woodward ?Procedure Date: 03/11/2022 2:04 PM ?MRN: [id number] ?Endoscopist: Zaina Jenkin E. Candis Schatz , MD ?Age: 66 ?Referring MD:  ?Date of Birth: [birth year] ?Gender: Female ?Account #: [id number] ?Procedure:                Colonoscopy ?Indications:              Screening for colorectal malignant neoplasm, This  ?                          is the patient's first colonoscopy ?Medicines:                Monitored Anesthesia Care ?Procedure:                Pre-Anesthesia Assessment: ?                          - Prior to the procedure, a History and Physical  ?                          was performed, and patient medications and  ?                          allergies were reviewed. The patient's tolerance of  ?                          previous anesthesia was also reviewed. The risks  ?                          and benefits of the procedure and the sedation  ?                          options and risks were discussed with the patient.  ?                          All questions were answered, and informed consent  ?                          was obtained. Prior Anticoagulants: The patient has  ?                          taken no previous anticoagulant or antiplatelet  ?                          agents. ASA Grade Assessment: III - A patient with  ?                          severe systemic disease. After reviewing the risks  ?                          and benefits, the patient was deemed in  ?                          satisfactory condition to undergo the procedure. ?  After obtaining informed consent, the colonoscope  ?                          was passed under direct vision. Throughout the  ?                          procedure, the patient's blood pressure, pulse, and  ?                          oxygen saturations were monitored continuously. The  ?                          [id number] [id number]-H190TL Slim SB Colonoscope was introduced  ?                          through the  anus and advanced to the the terminal  ?                          ileum, with identification of the appendiceal  ?                          orifice and IC valve. The colonoscopy was performed  ?                          without difficulty. The patient tolerated the  ?                          procedure well. The quality of the bowel  ?                          preparation was good. The terminal ileum, ileocecal  ?                          valve, appendiceal orifice, and rectum were  ?                          photographed. ?Scope In: 2:45:05 PM ?Scope Out: 3:04:34 PM ?Scope Withdrawal Time: 0 hours 16 minutes 4 seconds  ?Total Procedure Duration: 0 hours 19 minutes 29 seconds  ?Findings:                 The perianal and digital rectal examinations were  ?                          normal. Pertinent negatives include normal  ?                          sphincter tone and no palpable rectal lesions. ?                          A 3 mm polyp was found in the transverse colon. The  ?                          polyp was sessile. The polyp was removed with a  ?  cold snare. Resection and retrieval were complete.  ?                          Estimated blood loss was minimal. ?                          A 4 mm polyp was found in the descending colon. The  ?                          polyp was sessile. The polyp was removed with a  ?                          cold snare. Resection and retrieval were complete.  ?                          Estimated blood loss was minimal. ?                          A 12 mm polyp was found in the rectum. The polyp  ?                          was sessile. The polyp was removed with a cold  ?                          snare. Resection and retrieval were complete.  ?                          Estimated blood loss was minimal. ?                          A few small-mouthed diverticula were found in the  ?                          sigmoid colon. ?                          The exam was  otherwise normal throughout the  ?                          examined colon. ?                          The terminal ileum showed bluish discoloration,  ?                          possibly suggestive of varices, although no obvious  ?                          vessels were seen. The ileum otherwise appeared  ?                          normal. ?                          The terminal ileum contained  a single 3 mm  ?                          diverticulum. ?                          The retroflexed view of the distal rectum and anal  ?                          verge was normal and showed no anal or rectal  ?                          abnormalities. ?Complications:            No immediate complications. ?Estimated Blood Loss:     Estimated blood loss was minimal. ?Impression:               - One 3 mm polyp in the transverse colon, removed  ?                          with a cold snare. Resected and retrieved. ?                          - One 4 mm polyp in the descending colon, removed  ?                          with a cold snare. Resected and retrieved. ?                          - One 12 mm polyp in the rectum, removed with a  ?                          cold snare. Resected and retrieved. ?                          - Questionable ileal varices. ?                          - The distal rectum and anal verge are normal on  ?                          retroflexion view. ?Recommendation:           - Patient has a contact number available for  ?                          emergencies. The signs and symptoms of potential  ?                          delayed complications were discussed with the  ?                          patient. Return to normal activities tomorrow.  ?                          Written discharge instructions  were provided to the  ?                          patient. ?                          - Resume previous diet. ?                          - Continue present medications. ?                          - Await  pathology results. ?                          - Repeat colonoscopy (date not yet determined) for  ?                          surveillance based on pathology results. ?Braddock Servellon E. Candis Schatz, MD ?03/11/2022 3:22:37 PM ?This report has been signed electronically. ?

## 2022-03-16 ENCOUNTER — Encounter (HOSPITAL_BASED_OUTPATIENT_CLINIC_OR_DEPARTMENT_OTHER): Payer: Self-pay | Admitting: Emergency Medicine

## 2022-03-16 ENCOUNTER — Emergency Department (HOSPITAL_BASED_OUTPATIENT_CLINIC_OR_DEPARTMENT_OTHER)
Admission: EM | Admit: 2022-03-16 | Discharge: 2022-03-16 | Disposition: A | Discharge status: Home / Self Care | Payer: Medicare HMO | Attending: Emergency Medicine | Admitting: Emergency Medicine

## 2022-03-16 ENCOUNTER — Telehealth: Payer: Self-pay

## 2022-03-16 ENCOUNTER — Other Ambulatory Visit: Payer: Self-pay

## 2022-03-16 DIAGNOSIS — M79671 Pain in right foot: Secondary | ICD-10-CM | POA: Insufficient documentation

## 2022-03-16 DIAGNOSIS — M7989 Other specified soft tissue disorders: Secondary | ICD-10-CM | POA: Diagnosis present

## 2022-03-16 DIAGNOSIS — R2241 Localized swelling, mass and lump, right lower limb: Secondary | ICD-10-CM

## 2022-03-16 NOTE — Telephone Encounter (Signed)
Called [phone] and left a message we tried to reach pt for a follow up call. maw  ?

## 2022-03-16 NOTE — ED Triage Notes (Signed)
Pt arrives to ED with c/o right foot swelling. This started on 4/8. Pt reports this started suddenly and she denies any injury/trauma to the foot. She reports the swelling has improved. She reports slight pain to the foot but "not much." No numbness or discoloration to the foot. She reports she cannot bear much weight on the foot.  ?

## 2022-03-16 NOTE — Telephone Encounter (Signed)
Patient is returning your call. Per patient, she is feeling fine and has no concerns. ?

## 2022-03-16 NOTE — Discharge Instructions (Addendum)
Use compression socks, you may buy this at any drugstore. ? ?Ibuprofen is a good medication to alternate with Tylenol.  You should also elevate the foot and follow-up with your primary care provider in the next week if your swelling does not continue to go down. ? ?Return with any worsening symptoms, chest pain, shortness of breath, dizziness. ?

## 2022-03-16 NOTE — ED Provider Notes (Signed)
Acute ?[facility] ?Provider Note ? ? ?CSN: [id number] ?Arrival date & time: 03/16/22  1114 ? ?  ? ?History ? ?Chief Complaint  ?Patient presents with  ? Foot Swelling  ? ? ?Leah Woodward is a 66 y.o. female presenting with plaint of right foot swelling since Saturday.  Says that she woke up with it and it has been improving over the past couple days.  Has tried some Tylenol which has not changed the discomfort.  Denies any numbness or tingling.  States she is having normal range of motion.  No known foot trauma, bug bite or laceration. ? ? ? ?  ? ?Home Medications ?Prior to Admission medications   ?Medication Sig Start Date End Date Taking? Authorizing Provider  ?albuterol (VENTOLIN HFA) 108 (90 Base) MCG/ACT inhaler Inhale 2 puffs into the lungs every 4 (four) hours as needed for wheezing or shortness of breath. 10/26/21   Brimage, Ronnette Juniper, DO  ?melatonin 5 MG TABS Take 1-2 tablets (5-10 mg total) by mouth at bedtime as needed. 01/23/21   Lyndee Hensen, DO  ?ondansetron (ZOFRAN) 4 MG tablet Take 1 tablet (4 mg total) by mouth every 8 (eight) hours as needed for nausea or vomiting. 01/04/22   Lyndee Hensen, DO  ?   ? ?Allergies    ?Patient has no known allergies.   ? ?Review of Systems   ?Review of Systems ? ?Physical Exam ?Updated Vital Signs ?BP 139/87 (BP Location: Right Arm)   Pulse (!) 129   Temp 99.6 ?F (37.6 ?C)   Resp 18   SpO2 99%  ?Physical Exam ?Vitals and nursing note reviewed.  ?Constitutional:   ?   Appearance: Normal appearance.  ?HENT:  ?   Head: Normocephalic and atraumatic.  ?Eyes:  ?   General: No scleral icterus. ?   Conjunctiva/sclera: Conjunctivae normal.  ?Pulmonary:  ?   Effort: Pulmonary effort is normal. No respiratory distress.  ?Musculoskeletal:     ?   General: Swelling present. Normal range of motion.  ?   Right lower leg: No edema.  ?   Left lower leg: No edema.  ?   Comments: 2+ pitting edema from the ankle through the foot.  No superimposing  skin infection.  Strong DP pulse. Full range of motion of the ankle in all MTPs  ?Skin: ?   Findings: No rash.  ?Neurological:  ?   Mental Status: She is alert.  ?Psychiatric:     ?   Mood and Affect: Mood normal.  ? ? ?ED Results / Procedures / Treatments   ?Labs ?(all labs ordered are listed, but only abnormal results are displayed) ?Labs Reviewed - No data to display ? ?EKG ?None ? ?Radiology ?No results found. ? ?Procedures ?Procedures  ? ? ?Medications Ordered in ED ?Medications - No data to display ? ?ED Course/ Medical Decision Making/ A&P ?  ?                        ?Medical Decision Making ? ?66 year old female presenting with right foot swelling.  Differential includes but is not limited to musculoskeletal injury, cellulitis and bug bite. ? ?Physical exam: Mild swelling to the dorsal foot, no obvious cellulitis superimposing patient's lower extremity edema.  Strong pulses. ? ?MDM/disposition: I considered a DVT study however patient has 0 risk factors and is low likely [place].  X-ray does not seem indicated due to lack of trauma.  These were offered however patient agrees  that It is reasonable to hold off on these at this time.  She is requesting pain medication.  Per chart review, patient has aa history of alcohol use disorder And is working with her primary care provider to decrease her intake.  At this time I do not want to give her any a addictive substance, additionally this is not indicated for this type of pain.  She has not tried ibuprofen.  Last kidney function last month was normal and she is stable to take NSAIDs and alternation with Tylenol.  She will try this, leg elevation and compression socks. ? ?Final Clinical Impression(s) / ED Diagnoses ?Final diagnoses:  ?Right foot pain  ? ? ?Rx / DC Orders ? ? ?Results and diagnoses were explained to the patient. Return precautions discussed in full. Patient had no additional questions and expressed complete understanding. ? ? ?This chart was dictated  using voice recognition software.  Despite best efforts to proofread,  errors can occur which can change the documentation meaning.  ? ?  ?Colleena Kurtenbach A, PA-C ?03/16/22 1229 ? ?  ?Fredia Sorrow, MD ?03/17/22 0719 ? ?

## 2022-03-22 ENCOUNTER — Ambulatory Visit (HOSPITAL_COMMUNITY)
Admission: RE | Admit: 2022-03-22 | Discharge: 2022-03-22 | Disposition: A | Discharge status: Home / Self Care | Payer: Medicare HMO | Source: Ambulatory Visit | Attending: Family Medicine | Admitting: Family Medicine

## 2022-03-22 ENCOUNTER — Ambulatory Visit (INDEPENDENT_AMBULATORY_CARE_PROVIDER_SITE_OTHER): Payer: Medicare HMO | Admitting: Family Medicine

## 2022-03-22 ENCOUNTER — Telehealth: Payer: Self-pay | Admitting: Physician Assistant

## 2022-03-22 ENCOUNTER — Encounter: Payer: Self-pay | Admitting: Family Medicine

## 2022-03-22 VITALS — BP 133/100 | HR 94 | Ht 65.0 in | Wt 92.6 lb

## 2022-03-22 DIAGNOSIS — R2241 Localized swelling, mass and lump, right lower limb: Secondary | ICD-10-CM | POA: Insufficient documentation

## 2022-03-22 DIAGNOSIS — R634 Abnormal weight loss: Secondary | ICD-10-CM

## 2022-03-22 DIAGNOSIS — M7989 Other specified soft tissue disorders: Secondary | ICD-10-CM | POA: Diagnosis not present

## 2022-03-22 DIAGNOSIS — F1010 Alcohol abuse, uncomplicated: Secondary | ICD-10-CM

## 2022-03-22 MED ORDER — IBUPROFEN 600 MG PO TABS: 600.0000 mg | ORAL_TABLET | Freq: Three times a day (TID) | ORAL | 0 refills | Status: DC | PRN

## 2022-03-22 NOTE — Telephone Encounter (Signed)
R/s pt's new hem appt per pt request. Pt is aware of new appt date and time.  ?

## 2022-03-22 NOTE — Patient Instructions (Addendum)
To discuss gallbladder removal scheduled for May 9th:  ?Georgianne Fick, MD ?General Surgery ?[address] ?[place] [address] ?Phone: [phone] ?Fax: [phone] ? ?Get your foot xray today: ? ?A ?[facility] ?[facility] ? [phone] ?Open ? Closes 5:30PM ? ?OR  ? ?B ?[facility] ?[place] ? [phone] ?Open ? Closes 5PM ? ?We also scheduled your for an ultrasound of your foot to be sure you don't have a blood clot in your leg.  ? ?Pick up your pain medication at the pharmacy. Take with food.  ? ? ?

## 2022-03-22 NOTE — Progress Notes (Signed)
? ?  SUBJECTIVE:  ? ?CHIEF COMPLAINT / HPI:  ? ?Chief Complaint  ?Patient presents with  ? Weight Loss  ? Reults   ? Foot Swelling  ? ? ? ?Leah Woodward is a 66 y.o. female here for weight loss follow up.  Continues to drink 1 strawberry boost daily.  Does not feel like she has lost any more weight.  Has followed up with GI.  No further vomiting.  Has not felt nauseous in a very long time.  States that she has not drank any alcohol in the last 3 weeks. ? ? ?Woke up on 4/9 with her foot swollen. Denies hitting foot or any trauma.  Went to the ED was given ibuprofen which helped somewhat with the pain.  Edema persist.  Has been wearing compression hose.  No history of gout or arthritis.  Her foot is never swollen this way before. ? ? ?PERTINENT  PMH / PSH: reviewed and updated as appropriate  ? ?OBJECTIVE:  ? ?BP (!) 133/100   Pulse 94   Ht '5\' 5"'$  (1.651 m)   Wt 92 lb 9.6 oz (42 kg)   SpO2 99%   BMI 15.41 kg/m?   ? ?Previous weight 02/16/22: 93 lb (42.2 kg)  ? ?GEN: pleasant well appearing female, in no acute distress  ?CV: regular rate and rhythm, no murmurs appreciated  ?RESP: no increased work of breathing, clear to ascultation bilaterally ?MSK:  ?Right foot: ?Inspection: No erythema, significant warmth, ecchymosis or bony deformity, + pitting edema, no tophi ?Palpation: Tenderness of the 1st MTP, ?ROM: Full active and passive range of motion ?Strength: 5/5 in all directions ?No ligamentous laxity ?No pain at the base of the fifth metatarsal ?Able to ambulate  without significant pain  ?-Neurovascularly intact, no instability noted ?SKIN: warm, dry ?PSYCH: Normal affect, appropriate speech and behavior  ? ? ? ?ASSESSMENT/PLAN:  ? ?Alcohol abuse ?Reports no drink in the last 3 weeks.   Applauded her recent cessation. Continues to decline naltrexone. Discussed effects of alcohol on her damaged liver. Continue to encourage alcohol cessation ? ?Unintended weight loss ?Patient is a 66 year old female with  unintended weight loss.  Lost 0.2 kg in the past month.   She continues to drink boost daily.  Mammogram was unremarkable. Colonoscopy showed 3 polyps.  CT chest for lung cancer screening showed a solitary nodule.  She would need to repeat at the end of June 2023. ? ?Secondary hemochromatosis ?Family hx of thalassemia/sickle cell in pt's mom.  MRI concerning for secondary hemochromatosis. She rescheduled her upcoming appointment with heme-oncology in the office today so that her sister can take her to her appointment  ?  ?Right foot Edema  ?DDx fracture, gout, inflammatory arthritis, cellulitis.  Obtain uric acid level and CBC.  Given unilateral swelling obtain right lower extremity Doppler.  Obtain right foot x-rays to assess for fracture/dislocation. ? ? ? ?Lyndee Hensen, DO ?PGY-3, [facility] ?03/22/2022  ? ? ? ? ? ? ? ? ?

## 2022-03-23 ENCOUNTER — Inpatient Hospital Stay: Payer: Medicare HMO | Admitting: Physician Assistant

## 2022-03-23 ENCOUNTER — Inpatient Hospital Stay: Payer: Medicare HMO

## 2022-03-23 LAB — CBC
Hematocrit: 28 % — ABNORMAL LOW (ref 34.0–46.6)
Hemoglobin: 8.8 g/dL — ABNORMAL LOW (ref 11.1–15.9)
MCH: 30 pg (ref 26.6–33.0)
MCHC: 31.4 g/dL — ABNORMAL LOW (ref 31.5–35.7)
MCV: 96 fL (ref 79–97)
NRBC: 1 % — ABNORMAL HIGH (ref 0–0)
Platelets: 194 10*3/uL (ref 150–450)
RBC: 2.93 x10E6/uL — ABNORMAL LOW (ref 3.77–5.28)
RDW: 14 % (ref 11.7–15.4)
WBC: 3.9 10*3/uL (ref 3.4–10.8)

## 2022-03-23 LAB — URIC ACID: Uric Acid: 10 mg/dL — ABNORMAL HIGH (ref 3.0–7.2)

## 2022-03-24 ENCOUNTER — Encounter: Payer: Self-pay | Admitting: Family Medicine

## 2022-03-25 ENCOUNTER — Encounter: Payer: Self-pay | Admitting: Gastroenterology

## 2022-03-25 NOTE — Assessment & Plan Note (Signed)
Patient is a 66 year old female with unintended weight loss.  Lost 0.2 kg in the past month.   She continues to drink boost daily.  Mammogram was unremarkable. Colonoscopy showed 3 polyps.  CT chest for lung cancer screening showed a solitary nodule.  She would need to repeat at the end of June 2023. ?

## 2022-03-25 NOTE — Assessment & Plan Note (Signed)
Family hx of thalassemia/sickle cell in pt's mom.  MRI concerning for secondary hemochromatosis. She rescheduled her upcoming appointment with heme-oncology in the office today so that her sister can take her to her appointment  ?

## 2022-03-25 NOTE — Assessment & Plan Note (Signed)
Reports no drink in the last 3 weeks.   Applauded her recent cessation. Continues to decline naltrexone. Discussed effects of alcohol on her damaged liver. Continue to encourage alcohol cessation ?

## 2022-04-06 ENCOUNTER — Inpatient Hospital Stay: Payer: Medicare HMO | Attending: Physician Assistant | Admitting: Physician Assistant

## 2022-04-06 ENCOUNTER — Inpatient Hospital Stay: Payer: Medicare HMO

## 2022-04-06 ENCOUNTER — Other Ambulatory Visit: Payer: Self-pay

## 2022-04-06 VITALS — BP 126/83 | HR 153 | Temp 97.2°F | Resp 17 | Wt 90.8 lb

## 2022-04-06 DIAGNOSIS — F1010 Alcohol abuse, uncomplicated: Secondary | ICD-10-CM | POA: Insufficient documentation

## 2022-04-06 DIAGNOSIS — F17210 Nicotine dependence, cigarettes, uncomplicated: Secondary | ICD-10-CM | POA: Insufficient documentation

## 2022-04-06 DIAGNOSIS — D649 Anemia, unspecified: Secondary | ICD-10-CM

## 2022-04-06 DIAGNOSIS — R7989 Other specified abnormal findings of blood chemistry: Secondary | ICD-10-CM | POA: Insufficient documentation

## 2022-04-06 DIAGNOSIS — J449 Chronic obstructive pulmonary disease, unspecified: Secondary | ICD-10-CM | POA: Insufficient documentation

## 2022-04-06 LAB — CBC WITH DIFFERENTIAL (CANCER CENTER ONLY)
Abs Immature Granulocytes: 0.03 10*3/uL (ref 0.00–0.07)
Basophils Absolute: 0 10*3/uL (ref 0.0–0.1)
Basophils Relative: 0 %
Eosinophils Absolute: 0 10*3/uL (ref 0.0–0.5)
Eosinophils Relative: 0 %
HCT: 27.7 % — ABNORMAL LOW (ref 36.0–46.0)
Hemoglobin: 9.5 g/dL — ABNORMAL LOW (ref 12.0–15.0)
Immature Granulocytes: 1 %
Lymphocytes Relative: 16 %
Lymphs Abs: 1.1 10*3/uL (ref 0.7–4.0)
MCH: 30.9 pg (ref 26.0–34.0)
MCHC: 34.3 g/dL (ref 30.0–36.0)
MCV: 90.2 fL (ref 80.0–100.0)
Monocytes Absolute: 0.4 10*3/uL (ref 0.1–1.0)
Monocytes Relative: 6 %
Neutro Abs: 5.1 10*3/uL (ref 1.7–7.7)
Neutrophils Relative %: 77 %
Platelet Count: 173 10*3/uL (ref 150–400)
RBC: 3.07 MIL/uL — ABNORMAL LOW (ref 3.87–5.11)
RDW: 16.5 % — ABNORMAL HIGH (ref 11.5–15.5)
WBC Count: 6.5 10*3/uL (ref 4.0–10.5)
nRBC: 2.5 % — ABNORMAL HIGH (ref 0.0–0.2)

## 2022-04-06 LAB — CMP (CANCER CENTER ONLY)
ALT: 25 U/L (ref 0–44)
AST: 62 U/L — ABNORMAL HIGH (ref 15–41)
Albumin: 4 g/dL (ref 3.5–5.0)
Alkaline Phosphatase: 95 U/L (ref 38–126)
Anion gap: 11 (ref 5–15)
BUN: 9 mg/dL (ref 8–23)
CO2: 27 mmol/L (ref 22–32)
Calcium: 9.4 mg/dL (ref 8.9–10.3)
Chloride: 101 mmol/L (ref 98–111)
Creatinine: 0.79 mg/dL (ref 0.44–1.00)
GFR, Estimated: >60 mL/min (ref >60)
Glucose, Bld: 117 mg/dL — ABNORMAL HIGH (ref 70–99)
Potassium: 3.8 mmol/L (ref 3.5–5.1)
Sodium: 139 mmol/L (ref 135–145)
Total Bilirubin: 2.2 mg/dL — ABNORMAL HIGH (ref 0.3–1.2)
Total Protein: 7.3 g/dL (ref 6.5–8.1)

## 2022-04-06 LAB — FOLATE: Folate: 3.6 ng/mL — ABNORMAL LOW (ref >5.9)

## 2022-04-06 LAB — RETIC PANEL
Immature Retic Fract: 25.9 % — ABNORMAL HIGH (ref 2.3–15.9)
RBC.: 3.08 MIL/uL — ABNORMAL LOW (ref 3.87–5.11)
Retic Count, Absolute: 46.5 10*3/uL (ref 19.0–186.0)
Retic Ct Pct: 1.5 % (ref 0.4–3.1)
Reticulocyte Hemoglobin: 34.9 pg (ref >27.9)

## 2022-04-06 LAB — IRON AND IRON BINDING CAPACITY (CC-WL,HP ONLY)
Iron: 328 ug/dL — ABNORMAL HIGH (ref 28–170)
Saturation Ratios: 88 % — ABNORMAL HIGH (ref 10.4–31.8)
TIBC: 373 ug/dL (ref 250–450)
UIBC: 45 ug/dL

## 2022-04-06 LAB — SAVE SMEAR(SSMR), FOR PROVIDER SLIDE REVIEW

## 2022-04-06 LAB — VITAMIN B12: Vitamin B-12: 350 pg/mL (ref 180–914)

## 2022-04-06 LAB — C-REACTIVE PROTEIN: CRP: <0.5 mg/dL (ref <1.0)

## 2022-04-06 LAB — SEDIMENTATION RATE: Sed Rate: 5 mm/hr (ref 0–22)

## 2022-04-06 NOTE — Patient Instructions (Signed)
Thank you for choosing [facility] to provide your care.   ?Should you have questions after your visit to the [facility]), please contact this office at [phone] between 8:30 AM and 4:30 PM.  ?Voice mails left after 4:00 PM may not be returned until the following business day.  ?Calls received after 4:30 PM will be answered by an off-site Nurse Triage Line. ?   ?Prescription Refills:  Please have your pharmacy contact us directly for most prescription requests.  Contact the office directly for refills of narcotics (pain medications). Allow 48-72 hours for refills. ? ?Appointments: Please contact the [facility] scheduling department [phone] for questions regarding [facility] appointment scheduling.  Contact the schedulers with any scheduling changes so that your appointment can be rescheduled in a timely manner.  ? ?Central Scheduling for [facility] [phone] - Call to schedule procedures such as PET scans, CT scans, MRI, Ultrasound, etc. ? ?To afford each patient quality time with our providers, please arrive 30 minutes before your scheduled appointment time.  If you arrive late for your appointment, you may be asked to reschedule.  We strive to give you quality time with our providers, and arriving late affects you and other patients whose appointments are after yours. If you are a no show for multiple scheduled visits, you may be dismissed from the clinic at the providers discretion.   ?  ?Resources: ?[place] [employer] 4254767338 for additional information on assistance programs or assistance connecting with community support programs   ?[place]  857-703-4267: Information regarding food stamps, Medicaid, and utility assistance ?[place] [employer] [place] [employer]'s shared-ride transportation service for eligible riders who have a disability that prevents them from riding the fixed route bus.   ?Medicare [place] [phone]  Helps people with Medicare understand their rights and benefits, navigate the Medicare system, and secure the quality healthcare they deserve ?[place] 501-667-5208 Assists patients locate various types of support and financial assistance ?Cancer Care: 1-800-813-HOPE 2035148613) Provides financial assistance, online support groups, medication/co-pay assistance.   ?Transportation Assistance for appointments at [facility] ? ?Again, thank you for choosing [facility] for your care.    ?  ? ?

## 2022-04-06 NOTE — Progress Notes (Signed)
?[name] ?[phone]   [phone] ? ?NOTE ? ?Patient Care Team: ?Lyndee Hensen, DO as PCP - General (Family Medicine) ? ? ?CHIEF COMPLAINTS/PURPOSE OF CONSULTATION:  ?Normocytic anemia ? ?HISTORY OF PRESENTING ILLNESS:  ?Leah Woodward 66 y.o. female with a medical history significant for COPD, alcohol abuse, secondary hemochromatosis being seen today for evaluation of anemia. On review of the previous records she has had low hemoglobin for 5 months. She has had a normal endoscopy and colonoscopy completed on 03/11/2022. An MRI of the abdomen was obtained on 02/09/2022 that showed iron deposition in the liver and spleen which prompted hemochromatosis testing. This test was negative, so the hemochromatosis is likely due to a secondary cause. GI referred [name] to hematology to help with evaluation of the anemia. ? ?Of note, she does reports having iron deficiency anemia about 30 years ago. At that time she was given an iron supplement. She is currently not taking an iron supplement at this time.  ? ?Patient does report some fatigue along with low energy today. She is still able to complete her ADLs as needed. She denies any SOB, dizziness, palpitations, diarrhea, constipation or abdominal pain. She also denies any melena, hematochezia, vaginal bleeding, hemoptysis, or epistaxis. She does report some ice chip cravings. She has no other complaints. Rest of the 10 point ROS is below.  ? ? ?MEDICAL HISTORY:  ?No past medical history on file. ? ?SURGICAL HISTORY: ?No past surgical history on file. ? ?SOCIAL HISTORY: ?Social History  ? ?Socioeconomic History  ? Marital status: Married  ?  Spouse name: Not on file  ? Number of children: Not on file  ? Years of education: Not on file  ? Highest education level: Not on file  ?Occupational History  ? Not on file  ?Tobacco Use  ? Smoking status: Every Day  ? Smokeless tobacco: Never  ?Vaping Use  ? Vaping Use: Never used   ?Substance and Sexual Activity  ? Alcohol use: Yes  ?  Alcohol/week: 2.0 standard drinks  ?  Types: 2 Shots of liquor per week  ? Drug use: Never  ? Sexual activity: Not Currently  ?Other Topics Concern  ? Not on file  ?Social History Narrative  ? Not on file  ? ?Social Determinants of Health  ? ?Financial Resource Strain: Not on file  ?Food Insecurity: Not on file  ?Transportation Needs: Not on file  ?Physical Activity: Not on file  ?Stress: Not on file  ?Social Connections: Not on file  ?Intimate Partner Violence: Not on file  ? ? ?FAMILY HISTORY: ?Family History  ?Problem Relation Age of Onset  ? Breast cancer Neg Hx   ? Colon cancer Neg Hx   ? Stomach cancer Neg Hx   ? Esophageal cancer Neg Hx   ? Colon polyps Neg Hx   ? ? ?ALLERGIES:  has No Known Allergies. ? ?MEDICATIONS:  ?Current Outpatient Medications  ?Medication Sig Dispense Refill  ? albuterol (VENTOLIN HFA) 108 (90 Base) MCG/ACT inhaler Inhale 2 puffs into the lungs every 4 (four) hours as needed for wheezing or shortness of breath. (Patient not taking: Reported on 04/06/2022) 6.7 g 0  ? ibuprofen (ADVIL) 600 MG tablet Take 1 tablet (600 mg total) by mouth every 8 (eight) hours as needed. (Patient not taking: Reported on 04/06/2022) 30 tablet 0  ? melatonin 5 MG TABS Take 1-2 tablets (5-10 mg total) by mouth at bedtime as needed. (Patient not taking: Reported on 04/06/2022)  60 tablet 2  ? ondansetron (ZOFRAN) 4 MG tablet Take 1 tablet (4 mg total) by mouth every 8 (eight) hours as needed for nausea or vomiting. (Patient not taking: Reported on 04/06/2022) 20 tablet 0  ? ?Current Facility-Administered Medications  ?Medication Dose Route Frequency Provider Last Rate Last Admin  ? ondansetron (ZOFRAN-ODT) disintegrating tablet 4 mg  4 mg Oral Once Brimage, Vondra, DO      ? ? ?REVIEW OF SYSTEMS:   ?Constitutional: ( - ) fevers, ( - )  chills , ( - ) night sweats ?Eyes: ( - ) blurriness of vision, ( - ) double vision, ( - ) watery eyes ?Ears, nose, mouth, throat,  and face: ( - ) mucositis, ( - ) sore throat ?Respiratory: ( - ) cough, ( - ) dyspnea, ( - ) wheezes ?Cardiovascular: ( - ) palpitation, ( - ) chest discomfort, ( - ) lower extremity swelling ?Gastrointestinal:  ( - ) nausea, ( - ) heartburn, ( - ) change in bowel habits ?Skin: ( - ) abnormal skin rashes ?Lymphatics: ( - ) new lymphadenopathy, ( - ) easy bruising ?Neurological: ( - ) numbness, ( - ) tingling, ( - ) new weaknesses ?Behavioral/Psych: ( - ) mood change, ( - ) new changes  ?All other systems were reviewed with the patient and are negative. ? ?PHYSICAL EXAMINATION: ?ECOG PERFORMANCE STATUS: 1 - Symptomatic but completely ambulatory ? ?Vitals:  ? 04/06/22 1406  ?BP: 126/83  ?Pulse: (!) 153  ?Resp: 17  ?Temp: (!) 97.2 ?F (36.2 ?C)  ?SpO2: 98%  ? ?Filed Weights  ? 04/06/22 1406  ?Weight: 90 lb 12.8 oz (41.2 kg)  ? ? ?GENERAL: thin female in NAD  ?SKIN: skin color, texture, turgor are normal, no rashes or significant lesions ?EYES: conjunctiva are pink and non-injected, sclera icteric ?OROPHARYNX: no exudate, no erythema; lips, buccal mucosa, and tongue normal  ?NECK: supple, non-tender ?LYMPH:  no palpable lymphadenopathy in the cervical, axillary or supraclavicular lymph nodes.  ?LUNGS: clear to auscultation and percussion with normal breathing effort ?HEART: regular rate & rhythm and no murmurs and no lower extremity edema ?ABDOMEN: soft, non-tender, non-distended, normal bowel sounds ?Musculoskeletal: no cyanosis of digits and no clubbing  ?PSYCH: alert & oriented x 3, fluent speech ?NEURO: no focal motor/sensory deficits ? ?LABORATORY DATA:  ?I have reviewed the data as listed ? ?  Latest Ref Rng & Units 04/06/2022  ?  3:14 PM 03/22/2022  ? 12:08 PM 02/11/2022  ?  3:40 PM  ?CBC  ?WBC 4.0 - 10.5 K/uL 6.5   3.9   3.4    ?Hemoglobin 12.0 - 15.0 g/dL 9.5   8.8   10.1    ?Hematocrit 36.0 - 46.0 % 27.7   28.0   31.6    ?Platelets 150 - 400 K/uL 173   194   118.0    ? ? ? ?  Latest Ref Rng & Units 04/06/2022  ?  3:14  PM 02/11/2022  ?  3:40 PM 01/04/2022  ?  3:53 PM  ?CMP  ?Glucose 70 - 99 mg/dL 117   94   134    ?BUN 8 - 23 mg/dL '9   7   7    '$ ?Creatinine 0.44 - 1.00 mg/dL 0.79   0.58   0.54    ?Sodium 135 - 145 mmol/L 139   140   143    ?Potassium 3.5 - 5.1 mmol/L 3.8   3.5   3.3    ?  Chloride 98 - 111 mmol/L 101   101   99    ?CO2 22 - 32 mmol/L '27   26   25    '$ ?Calcium 8.9 - 10.3 mg/dL 9.4   8.8   8.9    ?Total Protein 6.5 - 8.1 g/dL 7.3   6.7   6.6    ?Total Bilirubin 0.3 - 1.2 mg/dL 2.2   1.4   1.0    ?Alkaline Phos 38 - 126 U/L 95   119   142    ?AST 15 - 41 U/L 62   287   139    ?ALT 0 - 44 U/L 25   82   51    ? ? ?RADIOGRAPHIC STUDIES: ?I have personally reviewed the radiological images as listed and agreed with the findings in the report. ?DG Foot Complete Right ? ?Result Date: 03/23/2022 ?CLINICAL DATA:  Right foot swelling. No known trauma. Symptoms entire foot into great toe. EXAM: RIGHT FOOT COMPLETE - 3+ VIEW COMPARISON:  None. FINDINGS: Mildly decreased bone mineralization. Minimal great toe metatarsophalangeal joint space narrowing and subchondral sclerosis degenerative change. Mild regional soft tissue swelling. No acute fracture or dislocation. IMPRESSION: Minimal great toe metatarsophalangeal joint osteoarthritis with mild soft tissue swelling. Electronically Signed   By: Yvonne Kendall M.D.   On: 03/23/2022 09:51  ? ?VAS [place] LOWER EXTREMITY VENOUS (DVT) ? ?Result Date: 03/22/2022 ? Lower Venous DVT Study Patient Name:  AGGIE DOUSE  Date of Exam:   03/22/2022 Medical Rec #: [id number]              Accession #:    [id number] Date of Birth: 10/07/1956              Patient Gender: F Patient Age:   74 years Exam Location:  [facility] Procedure:      VAS [place] LOWER EXTREMITY VENOUS (DVT) Referring Phys: TODD MCDIARMID --------------------------------------------------------------------------------  Indications: Swelling.  Comparison Study: No prior studies. Performing Technologist: Darlin Coco RDMS, RVT   Examination Guidelines: A complete evaluation includes B-mode imaging, spectral Doppler, color Doppler, and power Doppler as needed of all accessible portions of each vessel. Bilateral testing is considere

## 2022-04-07 DIAGNOSIS — D649 Anemia, unspecified: Secondary | ICD-10-CM | POA: Insufficient documentation

## 2022-04-07 LAB — FERRITIN: Ferritin: 3275 ng/mL — ABNORMAL HIGH (ref 11–307)

## 2022-04-09 LAB — MULTIPLE MYELOMA PANEL, SERUM
Albumin SerPl Elph-Mcnc: 3.6 g/dL (ref 2.9–4.4)
Albumin/Glob SerPl: 1.2 (ref 0.7–1.7)
Alpha 1: 0.3 g/dL (ref 0.0–0.4)
Alpha2 Glob SerPl Elph-Mcnc: 0.7 g/dL (ref 0.4–1.0)
B-Globulin SerPl Elph-Mcnc: 1.3 g/dL (ref 0.7–1.3)
Gamma Glob SerPl Elph-Mcnc: 0.9 g/dL (ref 0.4–1.8)
Globulin, Total: 3.2 g/dL (ref 2.2–3.9)
IgA: 623 mg/dL — ABNORMAL HIGH (ref 87–352)
IgG (Immunoglobin G), Serum: 1013 mg/dL (ref 586–1602)
IgM (Immunoglobulin M), Srm: 91 mg/dL (ref 26–217)
Total Protein ELP: 6.8 g/dL (ref 6.0–8.5)

## 2022-04-09 LAB — METHYLMALONIC ACID, SERUM: Methylmalonic Acid, Quantitative: 156 nmol/L (ref 0–378)

## 2022-04-13 ENCOUNTER — Other Ambulatory Visit: Payer: Self-pay | Admitting: Physician Assistant

## 2022-04-13 MED ORDER — FOLIC ACID 1 MG PO TABS: 1.0000 mg | ORAL_TABLET | Freq: Every day | ORAL | 3 refills | Status: DC

## 2022-04-14 ENCOUNTER — Telehealth: Payer: Self-pay

## 2022-04-14 NOTE — Telephone Encounter (Signed)
Pt advised and verbalized understanding.

## 2022-04-14 NOTE — Telephone Encounter (Signed)
-----   Message from Lincoln Brigham, PA-C sent at 04/13/2022 11:49 PM EDT ----- ?Please notify patient that she has folate deficiency. Sent prescription of folic acid for her to start taking once a day. Need to see her back in 3 months to repeat labs.  ?

## 2022-06-07 ENCOUNTER — Other Ambulatory Visit: Payer: Self-pay | Admitting: General Surgery

## 2022-06-07 DIAGNOSIS — K828 Other specified diseases of gallbladder: Secondary | ICD-10-CM | POA: Diagnosis not present

## 2022-06-15 ENCOUNTER — Other Ambulatory Visit: Payer: Self-pay | Admitting: Physician Assistant

## 2022-06-15 DIAGNOSIS — R7989 Other specified abnormal findings of blood chemistry: Secondary | ICD-10-CM

## 2022-06-15 NOTE — Progress Notes (Signed)
Us/

## 2022-06-21 ENCOUNTER — Ambulatory Visit: Payer: Medicare HMO | Admitting: Family Medicine

## 2022-06-21 NOTE — Progress Notes (Deleted)
    SUBJECTIVE:   CHIEF COMPLAINT / HPI:   ***  PERTINENT  PMH / PSH: ***  OBJECTIVE:   There were no vitals taken for this visit.  ***  ASSESSMENT/PLAN:   No problem-specific Assessment & Plan notes found for this encounter.     [place]

## 2022-07-05 ENCOUNTER — Other Ambulatory Visit: Payer: Self-pay

## 2022-07-05 ENCOUNTER — Telehealth: Payer: Self-pay

## 2022-07-05 DIAGNOSIS — R7989 Other specified abnormal findings of blood chemistry: Secondary | ICD-10-CM

## 2022-07-05 NOTE — Telephone Encounter (Signed)
-----   Message from Daryel November, MD sent at 07/01/2022  6:41 AM EDT ----- Yes, thank you.  Her hematologist had informed me she was placing a liver biopsy order, but this appears to be cancelled.  Would be beneficial to sit down with her and discuss this further.  Vaughan Basta,  Can you get Ms. Reller scheduled for a repeat office visit?  Would also like to check a hepatic panel and ferritin level prior to the visit.  Thanks   ----- Message ----- From: Noralyn Pick, NP Sent: 06/28/2022   7:54 AM EDT To: Daryel November, MD  Hi Dr. Candis Schatz, please review general surgery consult, per Dr. Barry Dienes 06/07/2022,  no plans for a cholecystectomy at this time,  possible eventual cholecystectomy with recommendations to consider a future liver bx with IR.  Do you want patient to follow up with you in office? Last LFTs 04/2022 significantly improved.

## 2022-07-05 NOTE — Telephone Encounter (Signed)
Lab orders in epic. Pt scheduled to see Dr. Candis Schatz 08/11/22 at 10:30am. Appt letter mailed to pt.

## 2022-07-07 ENCOUNTER — Other Ambulatory Visit: Payer: Self-pay | Admitting: Hematology and Oncology

## 2022-07-07 ENCOUNTER — Telehealth: Payer: Self-pay | Admitting: Hematology and Oncology

## 2022-07-07 ENCOUNTER — Inpatient Hospital Stay: Payer: Medicare HMO

## 2022-07-07 ENCOUNTER — Inpatient Hospital Stay: Payer: Medicare HMO | Admitting: Hematology and Oncology

## 2022-07-07 DIAGNOSIS — R7989 Other specified abnormal findings of blood chemistry: Secondary | ICD-10-CM

## 2022-07-07 DIAGNOSIS — D649 Anemia, unspecified: Secondary | ICD-10-CM

## 2022-07-07 NOTE — Telephone Encounter (Signed)
Per 8/2 phone line pt called to r/s appointment r/s per pt request

## 2022-07-09 ENCOUNTER — Other Ambulatory Visit: Payer: Self-pay

## 2022-07-09 ENCOUNTER — Telehealth: Payer: Self-pay

## 2022-07-09 ENCOUNTER — Inpatient Hospital Stay: Payer: Medicare HMO | Admitting: Hematology and Oncology

## 2022-07-09 ENCOUNTER — Inpatient Hospital Stay: Payer: Medicare HMO | Attending: Physician Assistant

## 2022-07-09 VITALS — BP 158/103 | HR 130 | Temp 97.5°F | Resp 22 | Wt 89.2 lb

## 2022-07-09 DIAGNOSIS — D696 Thrombocytopenia, unspecified: Secondary | ICD-10-CM | POA: Diagnosis not present

## 2022-07-09 DIAGNOSIS — R7989 Other specified abnormal findings of blood chemistry: Secondary | ICD-10-CM | POA: Diagnosis not present

## 2022-07-09 DIAGNOSIS — D649 Anemia, unspecified: Secondary | ICD-10-CM | POA: Diagnosis present

## 2022-07-09 LAB — CBC WITH DIFFERENTIAL (CANCER CENTER ONLY)
Abs Immature Granulocytes: 0.02 10*3/uL (ref 0.00–0.07)
Basophils Absolute: 0 10*3/uL (ref 0.0–0.1)
Basophils Relative: 0 %
Eosinophils Absolute: 0 10*3/uL (ref 0.0–0.5)
Eosinophils Relative: 0 %
HCT: 31.8 % — ABNORMAL LOW (ref 36.0–46.0)
Hemoglobin: 10.7 g/dL — ABNORMAL LOW (ref 12.0–15.0)
Immature Granulocytes: 0 %
Lymphocytes Relative: 20 %
Lymphs Abs: 1 10*3/uL (ref 0.7–4.0)
MCH: 29.6 pg (ref 26.0–34.0)
MCHC: 33.6 g/dL (ref 30.0–36.0)
MCV: 87.8 fL (ref 80.0–100.0)
Monocytes Absolute: 0.4 10*3/uL (ref 0.1–1.0)
Monocytes Relative: 9 %
Neutro Abs: 3.4 10*3/uL (ref 1.7–7.7)
Neutrophils Relative %: 71 %
Platelet Count: 97 10*3/uL — ABNORMAL LOW (ref 150–400)
RBC: 3.62 MIL/uL — ABNORMAL LOW (ref 3.87–5.11)
RDW: 15.6 % — ABNORMAL HIGH (ref 11.5–15.5)
WBC Count: 4.8 10*3/uL (ref 4.0–10.5)
nRBC: 2.3 % — ABNORMAL HIGH (ref 0.0–0.2)

## 2022-07-09 LAB — CMP (CANCER CENTER ONLY)
ALT: 67 U/L — ABNORMAL HIGH (ref 0–44)
AST: 174 U/L (ref 15–41)
Albumin: 4.3 g/dL (ref 3.5–5.0)
Alkaline Phosphatase: 154 U/L — ABNORMAL HIGH (ref 38–126)
Anion gap: 13 (ref 5–15)
BUN: 9 mg/dL (ref 8–23)
CO2: 31 mmol/L (ref 22–32)
Calcium: 9.3 mg/dL (ref 8.9–10.3)
Chloride: 98 mmol/L (ref 98–111)
Creatinine: 0.75 mg/dL (ref 0.44–1.00)
GFR, Estimated: >60 mL/min (ref >60)
Glucose, Bld: 145 mg/dL — ABNORMAL HIGH (ref 70–99)
Potassium: 2.9 mmol/L — ABNORMAL LOW (ref 3.5–5.1)
Sodium: 142 mmol/L (ref 135–145)
Total Bilirubin: 1.8 mg/dL — ABNORMAL HIGH (ref 0.3–1.2)
Total Protein: 7.5 g/dL (ref 6.5–8.1)

## 2022-07-09 LAB — RETIC PANEL
Immature Retic Fract: 26.3 % — ABNORMAL HIGH (ref 2.3–15.9)
RBC.: 3.67 MIL/uL — ABNORMAL LOW (ref 3.87–5.11)
Retic Count, Absolute: 57.6 10*3/uL (ref 19.0–186.0)
Retic Ct Pct: 1.6 % (ref 0.4–3.1)
Reticulocyte Hemoglobin: 32.1 pg (ref >27.9)

## 2022-07-09 LAB — FERRITIN: Ferritin: 1935 ng/mL — ABNORMAL HIGH (ref 11–307)

## 2022-07-09 LAB — VITAMIN B12: Vitamin B-12: 394 pg/mL (ref 180–914)

## 2022-07-09 LAB — FOLATE: Folate: 14.6 ng/mL (ref >5.9)

## 2022-07-09 NOTE — Telephone Encounter (Signed)
CRITICAL VALUE STICKER  CRITICAL VALUE: AST = [phone]  RECEIVER (on-site recipient of call): Yetta Glassman, CMA  DATE & TIME NOTIFIED: 07/09/22 at 2:55pm  MESSENGER (representative from lab): Hillary  MD NOTIFIED: Lorenso Courier  TIME OF NOTIFICATION: 07/09/22 at 3:00pm  RESPONSE: Notification given to Joesphine Bare., RN for follow-up with provider and pt.

## 2022-07-10 NOTE — Progress Notes (Signed)
[place] [phone]   [phone]  PROGRESS NOTE  Patient Care Team: Erskine Emery, MD as PCP - General (Family Medicine)  Hematological/Oncological History # Normocytic Anemia #Elevated Ferritin Levels   Interval History:  Leah Woodward 66 y.o. female with medical history significant for normocytic anemia and elevated ferritin levels who presents for a follow up visit. The patient's last visit was on 04/06/2022. In the interim since the last visit she was found to be folate deficient and started on p.o. folate therapy.  On exam today Leah Woodward is accompanied by her sister.  She reports that she has had no alcohol over the last 2 weeks.  She notes that she has not been having any difficulty with bleeding, bruising, or dark stools.  She notes her energy levels are "so-so".  She notes that she feels weak on occasion.  She did have some vomiting this morning as she was "nervous about this visit".  She notes that she is able to fulfill her day-to-day functions and has not been having any issues with lightheadedness, dizziness, or shortness of breath.  She otherwise denies any fevers, chills, sweats, nausea, vomiting or diarrhea.  A full 10 point ROS is listed below.  MEDICAL HISTORY:  No past medical history on file.  SURGICAL HISTORY: No past surgical history on file.  SOCIAL HISTORY: Social History   Socioeconomic History   Marital status: Married    Spouse name: Not on file   Number of children: Not on file   Years of education: Not on file   Highest education level: Not on file  Occupational History   Not on file  Tobacco Use   Smoking status: Every Day   Smokeless tobacco: Never  Vaping Use   Vaping Use: Never used  Substance and Sexual Activity   Alcohol use: Yes    Alcohol/week: 2.0 standard drinks of alcohol    Types: 2 Shots of liquor per week   Drug use: Never   Sexual activity: Not Currently  Other Topics Concern    Not on file  Social History Narrative   Not on file   Social Determinants of Health   Financial Resource Strain: Not on file  Food Insecurity: Not on file  Transportation Needs: Not on file  Physical Activity: Not on file  Stress: Not on file  Social Connections: Not on file  Intimate Partner Violence: Not on file    FAMILY HISTORY: Family History  Problem Relation Age of Onset   Breast cancer Neg Hx    Colon cancer Neg Hx    Stomach cancer Neg Hx    Esophageal cancer Neg Hx    Colon polyps Neg Hx     ALLERGIES:  has No Known Allergies.  MEDICATIONS:  Current Outpatient Medications  Medication Sig Dispense Refill   ibuprofen (ADVIL) 600 MG tablet      folic acid (FOLVITE) 1 MG tablet Take 1 tablet (1 mg total) by mouth daily. 30 tablet 3   Current Facility-Administered Medications  Medication Dose Route Frequency Provider Last Rate Last Admin   ondansetron (ZOFRAN-ODT) disintegrating tablet 4 mg  4 mg Oral Once Brimage, Vondra, DO        REVIEW OF SYSTEMS:   Constitutional: ( - ) fevers, ( - )  chills , ( - ) night sweats Eyes: ( - ) blurriness of vision, ( - ) double vision, ( - ) watery eyes Ears, nose, mouth, throat, and face: ( - )  mucositis, ( - ) sore throat Respiratory: ( - ) cough, ( - ) dyspnea, ( - ) wheezes Cardiovascular: ( - ) palpitation, ( - ) chest discomfort, ( - ) lower extremity swelling Gastrointestinal:  ( - ) nausea, ( - ) heartburn, ( - ) change in bowel habits Skin: ( - ) abnormal skin rashes Lymphatics: ( - ) new lymphadenopathy, ( - ) easy bruising Neurological: ( - ) numbness, ( - ) tingling, ( - ) new weaknesses Behavioral/Psych: ( - ) mood change, ( - ) new changes  All other systems were reviewed with the patient and are negative.  PHYSICAL EXAMINATION:  Vitals:   07/09/22 1508  BP: (!) 158/103  Pulse: (!) 130  Resp: (!) 22  Temp: (!) 97.5 F (36.4 C)  SpO2: 96%   Filed Weights   07/09/22 1508  Weight: 89 lb 3.2 oz (40.5  kg)    GENERAL: Well-appearing elderly African-American female, alert, no distress and comfortable SKIN: skin color, texture, turgor are normal, no rashes or significant lesions EYES: conjunctiva are pink and non-injected, sclera clear LUNGS: clear to auscultation and percussion with normal breathing effort HEART: regular rate & rhythm and no murmurs and no lower extremity edema Musculoskeletal: no cyanosis of digits and no clubbing  PSYCH: alert & oriented x 3, fluent speech.  Easily distracted. NEURO: no focal motor/sensory deficits  LABORATORY DATA:  I have reviewed the data as listed    Latest Ref Rng & Units 07/09/2022    2:17 PM 04/06/2022    3:14 PM 03/22/2022   12:08 PM  CBC  WBC 4.0 - 10.5 K/uL 4.8  6.5  3.9   Hemoglobin 12.0 - 15.0 g/dL 10.7  9.5  8.8   Hematocrit 36.0 - 46.0 % 31.8  27.7  28.0   Platelets 150 - 400 K/uL 97  173  194        Latest Ref Rng & Units 07/09/2022    2:17 PM 04/06/2022    3:14 PM 02/11/2022    3:40 PM  CMP  Glucose 70 - 99 mg/dL 145  117  94   BUN 8 - 23 mg/dL '9  9  7   '$ Creatinine 0.44 - 1.00 mg/dL 0.75  0.79  0.58   Sodium 135 - 145 mmol/L 142  139  140   Potassium 3.5 - 5.1 mmol/L 2.9  3.8  3.5   Chloride 98 - 111 mmol/L 98  101  101   CO2 22 - 32 mmol/L '31  27  26   '$ Calcium 8.9 - 10.3 mg/dL 9.3  9.4  8.8   Total Protein 6.5 - 8.1 g/dL 7.5  7.3  6.7   Total Bilirubin 0.3 - 1.2 mg/dL 1.8  2.2  1.4   Alkaline Phos 38 - 126 U/L 154  95  119   AST 15 - 41 U/L 174  62  287   ALT 0 - 44 U/L 67  25  82     Lab Results  Component Value Date   MPROTEIN Not Observed 04/06/2022   RADIOGRAPHIC STUDIES: No results found.  ASSESSMENT & PLAN Leah Woodward 66 y.o. female with medical history significant for normocytic anemia and elevated ferritin levels who presents for a follow up visit.  #Normocytic anemia: #Thromboctyopenia -- Prior nutritional labs concerning for a folate of 3.6.  Started on p.o. folate therapy at her last visit 3  months ago. --Labs today show evidence of count 4.8, hemoglobin 10.7, and  platelets of 97.  Improvement in hemoglobin though drop in platelets. --Suspect low platelets due to liver dysfunction.  Patient currently connected with gastroenterology.  Upcoming visit next month. --Likely anemia of chronic disease.  Continue p.o. folate therapy.  Reassess nutritional levels today.   #Elevated ferritin levels: --No evidence of hereditary hemochromatosis based on DNA testing completed on 02/16/2022 --Suspect liver disease due to alcoholic abuse and elevated LFTs -- Recommend ultrasound-guided liver biopsy in order to assure her elevated LFTs or not secondary to iron deposition.  Follow up: --RTC in 3 months with labs  Orders Placed This Encounter  Procedures   US BIOPSY (LIVER)    Standing Status:   Future    Standing Expiration Date:   07/11/2023    Order Specific Question:   Lab orders requested (DO NOT place separate lab orders, these will be automatically ordered during procedure specimen collection):    Answer:   Surgical Pathology    Order Specific Question:   Reason for Exam (SYMPTOM  OR DIAGNOSIS REQUIRED)    Answer:   elevated ferritin and LFTs, assess for iron overload/etiology of liver disease.    Order Specific Question:   Preferred location?    Answer:   [facility]    All questions were answered. The patient knows to call the clinic with any problems, questions or concerns.  A total of more than 30 minutes were spent on this encounter with face-to-face time and non-face-to-face time, including preparing to see the patient, ordering tests and/or medications, counseling the patient and coordination of care as outlined above.   Ledell Peoples, MD Department of Hematology/Oncology [place] [facility] Phone: [phone] Pager: [phone] Email: [email or web address]  07/10/2022 2:38 PM

## 2022-07-12 ENCOUNTER — Telehealth: Payer: Self-pay | Admitting: Hematology and Oncology

## 2022-07-12 LAB — HOMOCYSTEINE: Homocysteine: 31.5 umol/L — ABNORMAL HIGH (ref 0.0–17.2)

## 2022-07-12 NOTE — Telephone Encounter (Signed)
Per 8/4 los called pt and left message about appointment

## 2022-07-14 LAB — METHYLMALONIC ACID, SERUM: Methylmalonic Acid, Quantitative: 171 nmol/L (ref 0–378)

## 2022-08-06 ENCOUNTER — Other Ambulatory Visit (INDEPENDENT_AMBULATORY_CARE_PROVIDER_SITE_OTHER): Payer: Medicare HMO

## 2022-08-06 DIAGNOSIS — R7989 Other specified abnormal findings of blood chemistry: Secondary | ICD-10-CM

## 2022-08-06 LAB — HEPATIC FUNCTION PANEL
ALT: 35 U/L (ref 0–35)
AST: 72 U/L — ABNORMAL HIGH (ref 0–37)
Albumin: 4 g/dL (ref 3.5–5.2)
Alkaline Phosphatase: 88 U/L (ref 39–117)
Bilirubin, Direct: 0.4 mg/dL — ABNORMAL HIGH (ref 0.0–0.3)
Total Bilirubin: 1.4 mg/dL — ABNORMAL HIGH (ref 0.2–1.2)
Total Protein: 7.3 g/dL (ref 6.0–8.3)

## 2022-08-06 LAB — FERRITIN: Ferritin: >1500 ng/mL — ABNORMAL HIGH (ref 10.0–291.0)

## 2022-08-10 ENCOUNTER — Other Ambulatory Visit: Payer: Medicare HMO

## 2022-08-10 ENCOUNTER — Ambulatory Visit: Payer: Medicare HMO | Admitting: Hematology and Oncology

## 2022-08-11 ENCOUNTER — Encounter: Payer: Self-pay | Admitting: Gastroenterology

## 2022-08-11 ENCOUNTER — Ambulatory Visit: Payer: Medicare HMO | Admitting: Gastroenterology

## 2022-08-11 VITALS — BP 126/88 | HR 78 | Ht 65.0 in | Wt 90.6 lb

## 2022-08-11 DIAGNOSIS — R69 Illness, unspecified: Secondary | ICD-10-CM | POA: Diagnosis not present

## 2022-08-11 DIAGNOSIS — K709 Alcoholic liver disease, unspecified: Secondary | ICD-10-CM

## 2022-08-11 NOTE — Progress Notes (Signed)
HPI : Leah Woodward is a very pleasant 66 year old female with a history of depression, alcohol use disorder who was initially referred to our office earlier this year for a colonoscopy.  She was noted to have multiple lab abnormalities at that visit to include anemia (hemoglobin 10), low platelets (130), elevated liver enzymes AST predominant (AST 139, ALT 51, normal T. bili).  Hemoglobin had been as low as 8.8 in November of last year.  She had an MRI in March of this year which showed sludge in the gallbladder without evidence of cholecystitis or biliary dilation, with evidence of iron deposition in the liver suggestive of secondary hemochromatosis.  She had been having some symptoms of nausea and vomiting early in the year.  She was scheduled for an upper and lower endoscopy.  Her EGD showed a Hill grade 4 valve, but was otherwise normal.  Biopsies of her stomach and duodenum showed no evidence of H. Pylori.  Colonoscopy was notable only for a few small hyperplastic polyps and a 12 mm rectal tubular adenoma.  She was recommended to repeat colonoscopy in 3 years. An evaluation for causes of chronic liver disease was negative.  She has had a marked hyperferritinenemia, as high as 3004 months ago.  Most recent ferritin was 1900.  Genetic testing for hemochromatosis was negative. The patient was evaluated by general surgery and not felt to need to have a cholecystectomy based on the absence of persistent symptoms.  Today, the patient reports no GI symptoms.  She denies abdominal pain, nausea or vomiting.  She reports that her appetite is "okay".  Her weight has been stable.  She has regular bowel movements and denies problems with constipation, diarrhea or blood in the stool.  She denies symptoms of abdominal swelling, leg swelling, itching or brain fog.  She admits that she does still continue to drink stating that she will have 1 shot of rum mixed with limemade, and that "this will sometimes last 3  days".  The patient admits that she drank heavily following the loss of her 3 children, which still weighs heavily on her.  History reviewed. No pertinent past medical history. Alcohol use disorder Depression  EGD March 11, 2022: Hill grade 4 GEJ, otherwise normal, gastric biopsies negative for H. pylori Colonoscopy March 11, 2022: 12 mm rectal tubular adenoma, mild diverticulosis  Past Surgical History:  Procedure Laterality Date   APPENDECTOMY     Family History  Problem Relation Age of Onset   Breast cancer Neg Hx    Colon cancer Neg Hx    Stomach cancer Neg Hx    Esophageal cancer Neg Hx    Colon polyps Neg Hx    Social History   Tobacco Use   Smoking status: Every Day   Smokeless tobacco: Never  Vaping Use   Vaping Use: Never used  Substance Use Topics   Alcohol use: Yes    Alcohol/week: 2.0 standard drinks of alcohol    Types: 2 Shots of liquor per week   Drug use: Never   Current Outpatient Medications  Medication Sig Dispense Refill   folic acid (FOLVITE) 1 MG tablet Take 1 tablet (1 mg total) by mouth daily. 30 tablet 3   ibuprofen (ADVIL) 600 MG tablet  (Patient not taking: Reported on 08/11/2022)     Current Facility-Administered Medications  Medication Dose Route Frequency Provider Last Rate Last Admin   ondansetron (ZOFRAN-ODT) disintegrating tablet 4 mg  4 mg Oral Once Lyndee Hensen, DO  No Known Allergies   Review of Systems: All systems reviewed and negative except where noted in HPI.    No results found.  Physical Exam: BP 126/88 (BP Location: Left Arm, Patient Position: Sitting, Cuff Size: Normal)   Pulse 78   Ht '5\' 5"'$  (1.651 m)   Wt 90 lb 9.6 oz (41.1 kg)   BMI 15.08 kg/m  Constitutional: Pleasant, thin, African-American female in no acute distress. HEENT: Normocephalic and atraumatic. Conjunctivae are normal. No scleral icterus. Neck supple.  Cardiovascular: Normal rate, regular rhythm.  Pulmonary/chest: Effort normal and breath  sounds normal. No wheezing, rales or rhonchi. Abdominal: Soft, nondistended, nontender. Bowel sounds active throughout. There are no masses palpable. No hepatomegaly. Extremities: no edema Neurological: Alert and oriented to person place and time. Skin: Skin is warm and dry. No rashes noted. Psychiatric: Normal mood and affect. Behavior is normal.  CBC    Component Value Date/Time   WBC 4.8 07/09/2022 1417   WBC 3.4 (L) 02/11/2022 1540   RBC 3.67 (L) 07/09/2022 1418   RBC 3.62 (L) 07/09/2022 1417   HGB 10.7 (L) 07/09/2022 1417   HGB 8.8 (L) 03/22/2022 1208   HCT 31.8 (L) 07/09/2022 1417   HCT 28.0 (L) 03/22/2022 1208   PLT 97 (L) 07/09/2022 1417   PLT 194 03/22/2022 1208   MCV 87.8 07/09/2022 1417   MCV 96 03/22/2022 1208   MCH 29.6 07/09/2022 1417   MCHC 33.6 07/09/2022 1417   RDW 15.6 (H) 07/09/2022 1417   RDW 14.0 03/22/2022 1208   LYMPHSABS 1.0 07/09/2022 1417   LYMPHSABS 1.6 01/04/2022 1553   MONOABS 0.4 07/09/2022 1417   EOSABS 0.0 07/09/2022 1417   EOSABS 0.0 01/04/2022 1553   BASOSABS 0.0 07/09/2022 1417   BASOSABS 0.0 01/04/2022 1553    CMP     Component Value Date/Time   NA 142 07/09/2022 1417   NA 143 01/04/2022 1553   K 2.9 (L) 07/09/2022 1417   CL 98 07/09/2022 1417   CO2 31 07/09/2022 1417   GLUCOSE 145 (H) 07/09/2022 1417   BUN 9 07/09/2022 1417   BUN 7 (L) 01/04/2022 1553   CREATININE 0.75 07/09/2022 1417   CALCIUM 9.3 07/09/2022 1417   PROT 7.3 08/06/2022 1144   PROT 6.6 01/04/2022 1553   ALBUMIN 4.0 08/06/2022 1144   ALBUMIN 4.2 01/04/2022 1553   AST 72 (H) 08/06/2022 1144   AST 174 (HH) 07/09/2022 1417   ALT 35 08/06/2022 1144   ALT 67 (H) 07/09/2022 1417   ALKPHOS 88 08/06/2022 1144   BILITOT 1.4 (H) 08/06/2022 1144   BILITOT 1.8 (H) 07/09/2022 1417   GFRNONAA >60 07/09/2022 1417   GFRAA 71 12/09/2020 1610   Narrative & Impression CLINICAL DATA:  Follow-up possible gallbladder mass seen on prior ultrasound   EXAM: MRI ABDOMEN  WITHOUT AND WITH CONTRAST   TECHNIQUE: Multiplanar multisequence MR imaging of the abdomen was performed both before and after the administration of intravenous contrast.   CONTRAST:  17m GADAVIST GADOBUTROL 1 MMOL/ML IV SOLN   COMPARISON:  Ultrasound January 12, 2022   FINDINGS: Lower chest: No acute abnormality.   Hepatobiliary: Hepatic parenchyma demonstrates intrinsic T2 signal less than that of paraspinal musculature, consistent with iron deposition. No suspicious hepatic lesion.   T2 hypointense masslike focus layering in the dependent gallbladder measuring 2.1 x 1.0 x 0.7 cm on images 17/3 and 20/5, this is homogeneously T1 hyperintense without evidence of postcontrast enhancement on subtraction imaging and no reduced diffusivity, consistent  with tumefactive sludge. No evidence of acute cholecystitis.   No biliary ductal dilation.   Pancreas: Intrinsic T1 signal of the pancreatic parenchyma is within normal limits. No pancreatic ductal dilation. No cystic or solid hyperenhancing pancreatic lesion identified.   Spleen: Hypointense T2 signal less than that of background liver and paraspinal musculature consistent with iron deposition.   Adrenals/Urinary Tract: Bilateral adrenal glands appear normal. No hydronephrosis. No solid enhancing renal mass.   Stomach/Bowel: Visualized portions within the abdomen are unremarkable.   Vascular/Lymphatic: No pathologically enlarged lymph nodes identified. No abdominal aortic aneurysm demonstrated.   Other:  No significant abdominal free fluid.   Musculoskeletal: No suspicious bone lesions identified. Lumbar perineural root sleeve cysts.   IMPRESSION: 1. Tumefactive sludge in the gallbladder corresponding with the abnormality seen on prior ultrasound. No evidence of acute cholecystitis or biliary ductal dilation. 2. Iron deposition in the liver and spleen, consistent with secondary hemochromatosis.     Electronically  Signed   By: Dahlia Bailiff M.D.   On: 02/10/2022 08:21   ASSESSMENT AND PLAN: 66 year old female with alcoholic liver disease with associated hyperferritinemia, secondary hemochromatosis and chronic anemia.  I told the patient that I was very concerned she may have some advanced liver disease to include cirrhosis.  We discussed the neck step in evaluation for cirrhosis, which would include either liver biopsy or elastography.  I informed her of the liver biopsy may provide more helpful information and may be more accurate, but that an elastography would be able to tell us if she has advanced scarring of the liver.  The patient was very hesitant about undergoing liver biopsy and would prefer to avoid this if at all possible.  I feel like it is reasonable to proceed with an elastography.  I counseled the patient extensively on the necessity for complete abstinence from alcohol if she does not wish to die from liver disease in the near future.  The patient indicated that she understood the gravity of the situation, and that she "does not want cirrhosis".  She states that she is engaged in counseling to help with her alcohol use and dealing with her grief from the loss of her children.  She indicated that she would be amenable to considering medications to help reduce cravings and maintain abstinence.  Alcohol-induced liver disease, secondary hemochromatosis - Elastography - Counseled on complete alcohol abstinence - Consider initiating pharmacotherapy (would start naltrexone 50 mg p.o. daily); we will discuss at follow-up  Yamen Castrogiovanni E. Candis Schatz, [facility]   Erskine Emery, MD

## 2022-08-11 NOTE — Patient Instructions (Signed)
If you are age 67 or older, your body mass index should be between 23-30. Your Body mass index is 15.08 kg/m. If this is out of the aforementioned range listed, please consider follow up with your Primary Care Provider.  If you are age 82 or younger, your body mass index should be between 19-25. Your Body mass index is 15.08 kg/m. If this is out of the aformentioned range listed, please consider follow up with your Primary Care Provider.   You have been scheduled for an abdominal ultrasound with elastography at [facility] (1st floor). Your appointment is scheduled for 08/20/22 at 10:30am. Please arrive 15 minutes prior to your scheduled appointment for registration purposes. Make certain not to have anything to eat or drink 6 hours prior to your procedure. Should you need to reschedule your appointment, you may contact radiology at [phone].  Liver Elastography Various chronic liver diseases such as hepatitis B, C, and fatty liver disease can lead to tissue damage and subsequent scar tissue formation. As the scar tissue accumulates, the liver loses some of its elasticity and becomes stiffer. Liver elastography involves the use of a surface ultrasound probe that delivers a low frequency pulse or shear wave to a small volume of liver tissue under the rib cage. The transmission of the sound wave is completely painless. How Is a Liver Elastography Performed? The liver is located in the right upper abdomen under the rib cage. Patients are asked to lie flat on an examination table. A technician places the FibroScan probe between the ribs on the right side of the lower chest wall. A series of 10 painless pulses are then applied to the liver. The results are recorded on the equipment and an overall liver stiffness score is generated. This score is then interpreted by a qualified physician to predict the likelihood of advanced fibrosis or cirrhosis.  Patients are asked to wear loose clothing and  should not consume any liquids or solids for a minimum of 4 hours before the test to increase the likelihood of obtaining reliable test results. The scan will take 10 to 15 minutes to complete, but patients should plan on being available for 30 minutes to allow time for preparation  The [facility] providers would like to encourage you to use [facility] to communicate with providers for non-urgent requests or questions.  Due to long hold times on the telephone, sending your provider a message by [facility] may be a faster and more efficient way to get a response.  Please allow 48 business hours for a response.  Please remember that this is for non-urgent requests.   It was a pleasure to see you today!  Thank you for trusting me with your gastrointestinal care!    Scott E.Candis Schatz, MD

## 2022-08-15 ENCOUNTER — Other Ambulatory Visit: Payer: Self-pay | Admitting: Physician Assistant

## 2022-08-20 ENCOUNTER — Ambulatory Visit (HOSPITAL_COMMUNITY)
Admission: RE | Admit: 2022-08-20 | Discharge: 2022-08-20 | Disposition: A | Discharge status: Home / Self Care | Payer: Medicare HMO | Source: Ambulatory Visit | Attending: Gastroenterology | Admitting: Gastroenterology

## 2022-08-20 DIAGNOSIS — K7689 Other specified diseases of liver: Secondary | ICD-10-CM | POA: Diagnosis not present

## 2022-08-20 DIAGNOSIS — K709 Alcoholic liver disease, unspecified: Secondary | ICD-10-CM

## 2022-08-20 DIAGNOSIS — R69 Illness, unspecified: Secondary | ICD-10-CM | POA: Diagnosis not present

## 2022-08-24 ENCOUNTER — Other Ambulatory Visit: Payer: Self-pay

## 2022-08-24 MED ORDER — NALTREXONE HCL 50 MG PO TABS: 50.0000 mg | ORAL_TABLET | Freq: Every day | ORAL | 3 refills | Status: DC

## 2022-08-24 NOTE — Progress Notes (Signed)
I spoke with Ms. Mihalko today regarding her ultrasound/elastography results.  The results are suggestive of cirrhosis.  I informed her that no amount of alcohol would be considered safe, and that this is the only treatment needed at this time.  She was open to starting medical therapy to help with her alcohol addiction.  We will start naltrexone 50 mg p.o. daily.  We will plan to follow-up with her in 2 to 3 months.  Vaughan Basta, Can you please place prescription for naltrexone (50 mg 1 tablet by mouth daily #90, refills 3) and set her up for a follow-up office visit in 2 to 3 months?

## 2022-08-26 ENCOUNTER — Ambulatory Visit
Admission: RE | Admit: 2022-08-26 | Discharge: 2022-08-26 | Disposition: A | Discharge status: Home / Self Care | Payer: Medicare HMO | Source: Ambulatory Visit | Attending: Family Medicine | Admitting: Family Medicine

## 2022-08-26 DIAGNOSIS — R918 Other nonspecific abnormal finding of lung field: Secondary | ICD-10-CM | POA: Diagnosis not present

## 2022-08-26 DIAGNOSIS — R911 Solitary pulmonary nodule: Secondary | ICD-10-CM | POA: Diagnosis not present

## 2022-08-26 DIAGNOSIS — I700 Atherosclerosis of aorta: Secondary | ICD-10-CM | POA: Diagnosis not present

## 2022-08-26 DIAGNOSIS — J439 Emphysema, unspecified: Secondary | ICD-10-CM | POA: Diagnosis not present

## 2022-08-27 ENCOUNTER — Encounter: Payer: Self-pay | Admitting: Student

## 2022-09-03 ENCOUNTER — Telehealth: Payer: Self-pay | Admitting: *Deleted

## 2022-09-03 NOTE — Telephone Encounter (Signed)
TCT patient regarding a liver biopsy that Dr. Lorenso Courier recommends she have. Spoke to her. Advised that [facility] has been trying to reach her. Pt states she had something done recently about her liver. Advised that she had an [place] but Dr. Lorenso Courier also needs a biopsy. Pt voiced understanding. Provide phone # of [phone] to call to get this scheduled. Pt voiced understanding.

## 2022-09-09 ENCOUNTER — Other Ambulatory Visit: Payer: Self-pay | Admitting: Radiology

## 2022-09-09 DIAGNOSIS — R7989 Other specified abnormal findings of blood chemistry: Secondary | ICD-10-CM

## 2022-09-10 ENCOUNTER — Other Ambulatory Visit (HOSPITAL_COMMUNITY): Payer: Self-pay | Admitting: Physician Assistant

## 2022-09-13 ENCOUNTER — Ambulatory Visit (HOSPITAL_COMMUNITY)
Admission: RE | Admit: 2022-09-13 | Discharge: 2022-09-13 | Disposition: A | Discharge status: Home / Self Care | Payer: Medicare HMO | Source: Ambulatory Visit | Attending: Hematology and Oncology | Admitting: Hematology and Oncology

## 2022-09-13 ENCOUNTER — Other Ambulatory Visit: Payer: Self-pay

## 2022-09-13 DIAGNOSIS — R222 Localized swelling, mass and lump, trunk: Secondary | ICD-10-CM | POA: Diagnosis not present

## 2022-09-13 DIAGNOSIS — J432 Centrilobular emphysema: Secondary | ICD-10-CM | POA: Insufficient documentation

## 2022-09-13 DIAGNOSIS — I700 Atherosclerosis of aorta: Secondary | ICD-10-CM | POA: Insufficient documentation

## 2022-09-13 DIAGNOSIS — K7581 Nonalcoholic steatohepatitis (NASH): Secondary | ICD-10-CM | POA: Diagnosis not present

## 2022-09-13 DIAGNOSIS — F17200 Nicotine dependence, unspecified, uncomplicated: Secondary | ICD-10-CM | POA: Diagnosis not present

## 2022-09-13 DIAGNOSIS — D649 Anemia, unspecified: Secondary | ICD-10-CM | POA: Insufficient documentation

## 2022-09-13 DIAGNOSIS — K709 Alcoholic liver disease, unspecified: Secondary | ICD-10-CM | POA: Diagnosis not present

## 2022-09-13 DIAGNOSIS — Z122 Encounter for screening for malignant neoplasm of respiratory organs: Secondary | ICD-10-CM | POA: Diagnosis not present

## 2022-09-13 DIAGNOSIS — D696 Thrombocytopenia, unspecified: Secondary | ICD-10-CM | POA: Diagnosis not present

## 2022-09-13 DIAGNOSIS — F1011 Alcohol abuse, in remission: Secondary | ICD-10-CM | POA: Diagnosis not present

## 2022-09-13 DIAGNOSIS — R7989 Other specified abnormal findings of blood chemistry: Secondary | ICD-10-CM | POA: Diagnosis not present

## 2022-09-13 DIAGNOSIS — K7402 Hepatic fibrosis, advanced fibrosis: Secondary | ICD-10-CM | POA: Diagnosis not present

## 2022-09-13 DIAGNOSIS — E7989 Other specified disorders of purine and pyrimidine metabolism: Secondary | ICD-10-CM | POA: Diagnosis present

## 2022-09-13 LAB — CBC
HCT: 33.3 % — ABNORMAL LOW (ref 36.0–46.0)
Hemoglobin: 11 g/dL — ABNORMAL LOW (ref 12.0–15.0)
MCH: 28.8 pg (ref 26.0–34.0)
MCHC: 33 g/dL (ref 30.0–36.0)
MCV: 87.2 fL (ref 80.0–100.0)
Platelets: 176 10*3/uL (ref 150–400)
RBC: 3.82 MIL/uL — ABNORMAL LOW (ref 3.87–5.11)
RDW: 13 % (ref 11.5–15.5)
WBC: 3.1 10*3/uL — ABNORMAL LOW (ref 4.0–10.5)
nRBC: 0 % (ref 0.0–0.2)

## 2022-09-13 LAB — PROTIME-INR
INR: 1.1 (ref 0.8–1.2)
Prothrombin Time: 14 seconds (ref 11.4–15.2)

## 2022-09-13 MED ORDER — LIDOCAINE HCL (PF) 1 % IJ SOLN
INTRAMUSCULAR | Status: AC
  Filled 2022-09-13: qty 30

## 2022-09-13 MED ORDER — SODIUM CHLORIDE 0.9 % IV SOLN: INTRAVENOUS | Status: DC

## 2022-09-13 MED ORDER — MIDAZOLAM HCL 2 MG/2ML IJ SOLN
INTRAMUSCULAR | Status: AC
  Filled 2022-09-13: qty 2

## 2022-09-13 MED ORDER — GELATIN ABSORBABLE 12-7 MM EX MISC
CUTANEOUS | Status: AC
  Filled 2022-09-13: qty 1

## 2022-09-13 MED ORDER — FENTANYL CITRATE (PF) 100 MCG/2ML IJ SOLN
INTRAMUSCULAR | Status: AC | PRN
  Administered 2022-09-13: 25 ug via INTRAVENOUS

## 2022-09-13 MED ORDER — FENTANYL CITRATE (PF) 100 MCG/2ML IJ SOLN
INTRAMUSCULAR | Status: AC
  Filled 2022-09-13: qty 2

## 2022-09-13 MED ORDER — MIDAZOLAM HCL 2 MG/2ML IJ SOLN
INTRAMUSCULAR | Status: AC | PRN
  Administered 2022-09-13: .5 mg via INTRAVENOUS

## 2022-09-13 NOTE — H&P (Signed)
Chief Complaint: Patient was seen in consultation today for non-focal liver biopsy  Referring Physician(s): Dorsey,John T IV  Supervising Physician: Mir, Biochemist, clinical  Patient Status: [facility] - Out-pt  History of Present Illness: Leah Woodward is a 66 y.o. female with a medical history significant for alcohol abuse, normocytic anemia, elevated liver function tests and elevated ferritin levels. She is followed by hematology/oncology and recent lab work shows thrombocytopenia, possibly due to liver dysfunction. An US abdomen 08/20/22 shows findings suspicious for cirrhosis.   Interventional Radiology has been asked to evaluate this patient for an image-guided non-focal liver biopsy for further work up.   No past medical history on file.  Past Surgical History:  Procedure Laterality Date   APPENDECTOMY      Allergies: Patient has no known allergies.  Medications: Prior to Admission medications   Medication Sig Start Date End Date Taking? Authorizing Provider  folic acid (FOLVITE) 1 MG tablet TAKE 1 TABLET(1 MG) BY MOUTH DAILY 08/15/22  Yes Dede Query T, PA-C  naltrexone (DEPADE) 50 MG tablet Take 1 tablet (50 mg total) by mouth daily. 08/24/22  Yes Daryel November, MD     Family History  Problem Relation Age of Onset   Breast cancer Neg Hx    Colon cancer Neg Hx    Stomach cancer Neg Hx    Esophageal cancer Neg Hx    Colon polyps Neg Hx     Social History   Socioeconomic History   Marital status: Married    Spouse name: Not on file   Number of children: Not on file   Years of education: Not on file   Highest education level: Not on file  Occupational History   Not on file  Tobacco Use   Smoking status: Every Day   Smokeless tobacco: Never  Vaping Use   Vaping Use: Never used  Substance and Sexual Activity   Alcohol use: Yes    Alcohol/week: 2.0 standard drinks of alcohol    Types: 2 Shots of liquor per week   Drug use: Never   Sexual activity: Not  Currently  Other Topics Concern   Not on file  Social History Narrative   Not on file   Social Determinants of Health   Financial Resource Strain: Not on file  Food Insecurity: Not on file  Transportation Needs: Not on file  Physical Activity: Not on file  Stress: Not on file  Social Connections: Not on file    Review of Systems: A 12 point ROS discussed and pertinent positives are indicated in the HPI above.  All other systems are negative.  Review of Systems  Constitutional:  Negative for appetite change and fatigue.  Respiratory:  Negative for cough and shortness of breath.   Cardiovascular:  Negative for chest pain and leg swelling.  Gastrointestinal:  Negative for abdominal pain, diarrhea, nausea and vomiting.  Genitourinary:  Negative for flank pain.  Neurological:  Negative for dizziness and headaches.    Vital Signs: BP (!) 131/90   Pulse 99   Temp 99 F (37.2 C) (Oral)   Resp 17   Ht '5\' 5"'$  (1.651 m)   Wt 102 lb (46.3 kg)   SpO2 98%   BMI 16.97 kg/m   Physical Exam Constitutional:      General: She is not in acute distress.    Appearance: She is not ill-appearing.  HENT:     Mouth/Throat:     Mouth: Mucous membranes are moist.  Pharynx: Oropharynx is clear.  Cardiovascular:     Rate and Rhythm: Normal rate and regular rhythm.  Pulmonary:     Effort: Pulmonary effort is normal.  Abdominal:     Tenderness: There is no abdominal tenderness.  Skin:    General: Skin is warm and dry.  Neurological:     Mental Status: She is alert and oriented to person, place, and time.     Imaging: CT CHEST LCS NODULE F/U LOW DOSE WO CONTRAST  Result Date: 08/27/2022 CLINICAL DATA:  Lung cancer screening. Follow-up exam. Thirty-six pack-year history. Asymptomatic current smoker. EXAM: CT CHEST WITHOUT CONTRAST FOR LUNG CANCER SCREENING NODULE FOLLOW-UP TECHNIQUE: Multidetector CT imaging of the chest was performed following the standard protocol without IV  contrast. RADIATION DOSE REDUCTION: This exam was performed according to the departmental dose-optimization program which includes automated exposure control, adjustment of the mA and/or kV according to patient size and/or use of iterative reconstruction technique. COMPARISON:  01/29/2022 FINDINGS: Cardiovascular: Heart size appears within normal limits. Aortic atherosclerosis and coronary artery calcification. No pericardial effusion. Mediastinum/Nodes: No enlarged mediastinal, hilar, or axillary lymph nodes. Thyroid gland, trachea, and esophagus demonstrate no significant findings. Lungs/Pleura: No pleural effusion, airspace consolidation, atelectasis or pneumothorax. Centrilobular and paraseptal emphysema. There are a few scattered small lung nodules within both lungs. The largest nodule is in the perihilar aspect of the central right upper lobe with a mean derived diameter of 7 mm. These are all stable compared with the previous exam. No suspicious lung nodules identified. Upper Abdomen: No acute findings. Musculoskeletal: No chest wall mass or suspicious bone lesions identified. IMPRESSION: 1. Lung-RADS 2, benign appearance or behavior. Continue annual screening with low-dose chest CT without contrast in 12 months. 2. Aortic Atherosclerosis (ICD10-I70.0) and Emphysema (ICD10-J43.9). Electronically Signed   By: Kerby Moors M.D.   On: 08/27/2022 09:21   US ABDOMEN COMPLETE W/ELASTOGRAPHY  Result Date: 08/20/2022 CLINICAL DATA:  Alcoholic liver disease EXAM: ULTRASOUND ABDOMEN ULTRASOUND HEPATIC ELASTOGRAPHY TECHNIQUE: Sonography of the upper abdomen was performed. In addition, ultrasound elastography evaluation of the liver was performed. A region of interest was placed within the right lobe of the liver. Following application of a compressive sonographic pulse, tissue compressibility was assessed. Multiple assessments were performed at the selected site. Median tissue compressibility was determined.  Previously, hepatic stiffness was assessed by shear wave velocity. Based on recently published Society of Radiologists in Ultrasound consensus article, reporting is now recommended to be performed in the SI units of pressure (kiloPascals) representing hepatic stiffness/elasticity. The obtained result is compared to the published reference standards. (cACLD = compensated Advanced Chronic Liver Disease) COMPARISON:  01/12/2022 FINDINGS: ULTRASOUND ABDOMEN Gallbladder: Normally distended without stones or wall thickening. No pericholecystic fluid or sonographic Murphy sign. Common bile duct: Diameter: 4 mm, normal Liver: Echogenic parenchyma, can be seen with fatty infiltration, cirrhosis, and certain infiltrative disorders. No focal hepatic mass. Slightly nodular hepatic contours. Portal vein is patent on color Doppler imaging with normal direction of blood flow towards the liver. IVC: Normal appearance Pancreas: Normal appearance Spleen: Normal appearance, 2.1 cm length Right Kidney: Length: 9.7 cm. Normal morphology without mass or hydronephrosis. Left Kidney: Length: 8.8 cm. Normal morphology without mass or hydronephrosis. Abdominal aorta: Normal caliber Other findings: No free fluid ULTRASOUND HEPATIC ELASTOGRAPHY Device: Siemens Helix VTQ Patient position: Supine Transducer 9C2 Number of measurements: 12 Hepatic segment:  8 Median kPa: 9.7 IQR: 2.0 IQR/Median kPa ratio: 0.21 Data quality:  Good Diagnostic category: >9 kPa and ?13 kPa:  suggestive of cACLD, but needs further testing The use of hepatic elastography is applicable to patients with viral hepatitis and non-alcoholic fatty liver disease. At this time, there is insufficient data for the referenced cut-off values and use in other causes of liver disease, including alcoholic liver disease. Patients, however, may be assessed by elastography and serve as their own reference standard/baseline. In patients with non-alcoholic liver disease, the values  suggesting compensated advanced chronic liver disease (cACLD) may be lower, and patients may need additional testing with elasticity results of 7-9 kPa. Please note that abnormal hepatic elasticity and shear wave velocities may also be identified in clinical settings other than with hepatic fibrosis, such as: acute hepatitis, elevated right heart and central venous pressures including use of beta blockers, veno-occlusive disease (Budd-Chiari), infiltrative processes such as mastocytosis/amyloidosis/infiltrative tumor/lymphoma, extrahepatic cholestasis, with hyperemia in the post-prandial state, and with liver transplantation. Correlation with patient history, laboratory data, and clinical condition recommended. Diagnostic Categories: < or =5 kPa: high probability of being normal < or =9 kPa: in the absence of other known clinical signs, rules out cACLD >9 kPa and ?13 kPa: suggestive of cACLD, but needs further testing >13 kPa: highly suggestive of cACLD > or =17 kPa: highly suggestive of cACLD with an increased probability of clinically significant portal hypertension IMPRESSION: ULTRASOUND ABDOMEN: Echogenic hepatic parenchyma with slightly nodular hepatic margins suspicious for cirrhosis. Remainder of exam unremarkable. ULTRASOUND HEPATIC ELASTOGRAPHY: Median kPa:  9.7 Diagnostic category: >9 kPa and ?13 kPa: suggestive of cACLD, but needs further testing Electronically Signed   By: Lavonia Dana M.D.   On: 08/20/2022 14:11    Labs:  CBC: Recent Labs    03/22/22 1208 04/06/22 1514 07/09/22 1417 09/13/22 1151  WBC 3.9 6.5 4.8 3.1*  HGB 8.8* 9.5* 10.7* 11.0*  HCT 28.0* 27.7* 31.8* 33.3*  PLT 194 173 97* 176    COAGS: Recent Labs    01/04/22 1553 09/13/22 1151  INR 1.1 1.1    BMP: Recent Labs    01/04/22 1553 02/11/22 1540 04/06/22 1514 07/09/22 1417  NA 143 140 139 142  K 3.3* 3.5 3.8 2.9*  CL 99 101 101 98  CO2 '25 26 27 31  '$ GLUCOSE 134* 94 117* 145*  BUN 7* '7 9 9  '$ CALCIUM 8.9 8.8  9.4 9.3  CREATININE 0.54* 0.58 0.79 0.75  GFRNONAA  --   --  >60 >60    LIVER FUNCTION TESTS: Recent Labs    02/11/22 1540 04/06/22 1514 07/09/22 1417 08/06/22 1144  BILITOT 1.4* 2.2* 1.8* 1.4*  AST 287* 62* 174* 72*  ALT 82* 25 67* 35  ALKPHOS 119* 95 154* 88  PROT 6.7 7.3 7.5 7.3  ALBUMIN 4.0 4.0 4.3 4.0    TUMOR MARKERS: No results for input(s): "AFPTM", "CEA", "CA199", "CHROMGRNA" in the last 8760 hours.  Assessment and Plan:  Elevated LFTs; elevated ferritin: Leah Woodward, 66 year old female, presents today to the [facility] department for an image-guided non-focal liver biopsy.  Risks and benefits of this procedure were discussed with the patient and/or patient's family including, but not limited to bleeding, infection, damage to adjacent structures or low yield requiring additional tests.  All of the questions were answered and there is agreement to proceed. She has been NPO. She does not take any blood-thinning medications.   Consent signed and in chart.  Thank you for this interesting consult.  I greatly enjoyed meeting Chelsea K Allinson and look forward to participating in their care.  A copy of this report was sent to the requesting provider on this date.  Electronically Signed: Soyla Dryer, AGACNP-BC [phone] 09/13/2022, 1:10 PM   I spent a total of  30 Minutes   in face to face in clinical consultation, greater than 50% of which was counseling/coordinating care for non-focal liver biopsy

## 2022-09-13 NOTE — Sedation Documentation (Signed)
Short stay unable to accept report, charge nurse will call IR back.

## 2022-09-13 NOTE — Sedation Documentation (Signed)
5 minutes pressure to site post procedure, dressing applied, CDI, bedrest x 2 hrs per MD, orders to follow.

## 2022-09-13 NOTE — Progress Notes (Signed)
Patient was given discharge instructions. She verbalized understanding. 

## 2022-09-13 NOTE — Progress Notes (Signed)
Diane (sister) [phone] will need to be called at least 30 min before discharge to pick up and receive instructions. Not in facility,

## 2022-09-13 NOTE — Procedures (Signed)
Interventional Radiology Procedure Note  Procedure: US guided random liver biopsy  Indication: Elevated LFT's  Findings: Please refer to procedural dictation for full description.  Complications: None  EBL: < 10 mL  Miachel Roux, MD [phone]

## 2022-09-15 LAB — SURGICAL PATHOLOGY

## 2022-10-04 DIAGNOSIS — H5203 Hypermetropia, bilateral: Secondary | ICD-10-CM | POA: Diagnosis not present

## 2022-10-04 DIAGNOSIS — H5201 Hypermetropia, right eye: Secondary | ICD-10-CM | POA: Diagnosis not present

## 2022-10-04 DIAGNOSIS — H524 Presbyopia: Secondary | ICD-10-CM | POA: Diagnosis not present

## 2022-10-04 DIAGNOSIS — H52209 Unspecified astigmatism, unspecified eye: Secondary | ICD-10-CM | POA: Diagnosis not present

## 2022-10-12 ENCOUNTER — Other Ambulatory Visit: Payer: Self-pay | Admitting: Physician Assistant

## 2022-10-12 DIAGNOSIS — D649 Anemia, unspecified: Secondary | ICD-10-CM

## 2022-10-12 DIAGNOSIS — R7989 Other specified abnormal findings of blood chemistry: Secondary | ICD-10-CM

## 2022-10-13 ENCOUNTER — Other Ambulatory Visit: Payer: Self-pay

## 2022-10-13 ENCOUNTER — Inpatient Hospital Stay (HOSPITAL_BASED_OUTPATIENT_CLINIC_OR_DEPARTMENT_OTHER): Payer: Medicare HMO | Admitting: Physician Assistant

## 2022-10-13 ENCOUNTER — Inpatient Hospital Stay: Payer: Medicare HMO | Attending: Physician Assistant

## 2022-10-13 VITALS — BP 145/80 | HR 97 | Temp 97.3°F | Resp 17 | Wt 96.0 lb

## 2022-10-13 DIAGNOSIS — D649 Anemia, unspecified: Secondary | ICD-10-CM | POA: Insufficient documentation

## 2022-10-13 DIAGNOSIS — D696 Thrombocytopenia, unspecified: Secondary | ICD-10-CM | POA: Diagnosis not present

## 2022-10-13 DIAGNOSIS — R7989 Other specified abnormal findings of blood chemistry: Secondary | ICD-10-CM

## 2022-10-13 LAB — CBC WITH DIFFERENTIAL (CANCER CENTER ONLY)
Abs Immature Granulocytes: 0 10*3/uL (ref 0.00–0.07)
Basophils Absolute: 0 10*3/uL (ref 0.0–0.1)
Basophils Relative: 1 %
Eosinophils Absolute: 0 10*3/uL (ref 0.0–0.5)
Eosinophils Relative: 0 %
HCT: 30.9 % — ABNORMAL LOW (ref 36.0–46.0)
Hemoglobin: 10.4 g/dL — ABNORMAL LOW (ref 12.0–15.0)
Immature Granulocytes: 0 %
Lymphocytes Relative: 34 %
Lymphs Abs: 1.1 10*3/uL (ref 0.7–4.0)
MCH: 28.3 pg (ref 26.0–34.0)
MCHC: 33.7 g/dL (ref 30.0–36.0)
MCV: 84.2 fL (ref 80.0–100.0)
Monocytes Absolute: 0.3 10*3/uL (ref 0.1–1.0)
Monocytes Relative: 10 %
Neutro Abs: 1.8 10*3/uL (ref 1.7–7.7)
Neutrophils Relative %: 55 %
Platelet Count: 115 10*3/uL — ABNORMAL LOW (ref 150–400)
RBC: 3.67 MIL/uL — ABNORMAL LOW (ref 3.87–5.11)
RDW: 14.9 % (ref 11.5–15.5)
WBC Count: 3.3 10*3/uL — ABNORMAL LOW (ref 4.0–10.5)
nRBC: 0.6 % — ABNORMAL HIGH (ref 0.0–0.2)

## 2022-10-13 LAB — CMP (CANCER CENTER ONLY)
ALT: 50 U/L — ABNORMAL HIGH (ref 0–44)
AST: 75 U/L — ABNORMAL HIGH (ref 15–41)
Albumin: 4 g/dL (ref 3.5–5.0)
Alkaline Phosphatase: 77 U/L (ref 38–126)
Anion gap: 10 (ref 5–15)
BUN: 8 mg/dL (ref 8–23)
CO2: 26 mmol/L (ref 22–32)
Calcium: 9.2 mg/dL (ref 8.9–10.3)
Chloride: 105 mmol/L (ref 98–111)
Creatinine: 0.77 mg/dL (ref 0.44–1.00)
GFR, Estimated: >60 mL/min (ref >60)
Glucose, Bld: 116 mg/dL — ABNORMAL HIGH (ref 70–99)
Potassium: 3.2 mmol/L — ABNORMAL LOW (ref 3.5–5.1)
Sodium: 141 mmol/L (ref 135–145)
Total Bilirubin: 0.8 mg/dL (ref 0.3–1.2)
Total Protein: 6.8 g/dL (ref 6.5–8.1)

## 2022-10-13 LAB — IRON AND IRON BINDING CAPACITY (CC-WL,HP ONLY)
Iron: 300 ug/dL — ABNORMAL HIGH (ref 28–170)
Saturation Ratios: 99 % — ABNORMAL HIGH (ref 10.4–31.8)
TIBC: 304 ug/dL (ref 250–450)
UIBC: 4 ug/dL — ABNORMAL LOW (ref 148–442)

## 2022-10-13 LAB — FERRITIN: Ferritin: 1887 ng/mL — ABNORMAL HIGH (ref 11–307)

## 2022-10-13 NOTE — Progress Notes (Signed)
Leah [phone]   [phone]  PROGRESS NOTE  Patient Care Team: Erskine Emery, MD as PCP - General (Family Medicine)  Hematological/Oncological History # Normocytic Anemia #Elevated Ferritin Levels   Interval History:  Leah Woodward 66 y.o. female with medical history significant for normocytic anemia and elevated ferritin levels who presents for a follow up visit. The patient's last visit was on 07/09/2022. In the interim since the last visit she underwent liver biopsy on 09/13/2022.   On exam today Leah Woodward is unaccompanied for this visit. She reports that she is trying to decrease alcohol intake but drinks weekly. She reports her appetite has improved and now eats twice a day. She has good energy levels and is able to complete her ADLs on her own. She denies nausea, vomiting or abdominal pain. Her bowel habits are unchanged without any recurrent episodes of diarrhea or constipation. She denies easy bruising or signs of active bleeding. She denies fevers, chills, sweats, shortness of breath, chest pain or cough. She has no other complaints. A full 10 point ROS is listed below.  MEDICAL HISTORY:  No past medical history on file.  SURGICAL HISTORY: Past Surgical History:  Procedure Laterality Date   APPENDECTOMY      SOCIAL HISTORY: Social History   Socioeconomic History   Marital status: Married    Spouse name: Not on file   Number of children: Not on file   Years of education: Not on file   Highest education level: Not on file  Occupational History   Not on file  Tobacco Use   Smoking status: Every Day   Smokeless tobacco: Never  Vaping Use   Vaping Use: Never used  Substance and Sexual Activity   Alcohol use: Yes    Alcohol/week: 2.0 standard drinks of alcohol    Types: 2 Shots of liquor per week   Drug use: Never   Sexual activity: Not Currently  Other Topics Concern   Not on file  Social History  Narrative   Not on file   Social Determinants of Health   Financial Resource Strain: Not on file  Food Insecurity: Not on file  Transportation Needs: Not on file  Physical Activity: Not on file  Stress: Not on file  Social Connections: Not on file  Intimate Partner Violence: Not on file    FAMILY HISTORY: Family History  Problem Relation Age of Onset   Breast cancer Neg Hx    Colon cancer Neg Hx    Stomach cancer Neg Hx    Esophageal cancer Neg Hx    Colon polyps Neg Hx     ALLERGIES:  has No Known Allergies.  MEDICATIONS:  Current Outpatient Medications  Medication Sig Dispense Refill   folic acid (FOLVITE) 1 MG tablet TAKE 1 TABLET(1 MG) BY MOUTH DAILY 30 tablet 3   naltrexone (DEPADE) 50 MG tablet Take 1 tablet (50 mg total) by mouth daily. 90 tablet 3   Current Facility-Administered Medications  Medication Dose Route Frequency Provider Last Rate Last Admin   ondansetron (ZOFRAN-ODT) disintegrating tablet 4 mg  4 mg Oral Once Brimage, Vondra, DO        REVIEW OF SYSTEMS:   Constitutional: ( - ) fevers, ( - )  chills , ( - ) night sweats Eyes: ( - ) blurriness of vision, ( - ) double vision, ( - ) watery eyes Ears, nose, mouth, throat, and face: ( - ) mucositis, ( - ) sore throat Respiratory: ( - )  cough, ( - ) dyspnea, ( - ) wheezes Cardiovascular: ( - ) palpitation, ( - ) chest discomfort, ( - ) lower extremity swelling Gastrointestinal:  ( - ) nausea, ( - ) heartburn, ( - ) change in bowel habits Skin: ( - ) abnormal skin rashes Lymphatics: ( - ) new lymphadenopathy, ( - ) easy bruising Neurological: ( - ) numbness, ( - ) tingling, ( - ) new weaknesses Behavioral/Psych: ( - ) mood change, ( - ) new changes  All other systems were reviewed with the patient and are negative.  PHYSICAL EXAMINATION:  Vitals:   10/13/22 1100  BP: (!) 145/80  Pulse: 97  Resp: 17  Temp: (!) 97.3 F (36.3 C)  SpO2: 99%   Filed Weights   10/13/22 1100  Weight: 96 lb (43.5  kg)    GENERAL: Well-appearing elderly African-American female, alert, no distress and comfortable SKIN: skin color, texture, turgor are normal, no rashes or significant lesions EYES: conjunctiva are pink and non-injected, sclera clear LUNGS: clear to auscultation and percussion with normal breathing effort HEART: regular rate & rhythm and no murmurs and no lower extremity edema Musculoskeletal: no cyanosis of digits and no clubbing  PSYCH: alert & oriented x 3, fluent speech.  Easily distracted. NEURO: no focal motor/sensory deficits  LABORATORY DATA:  I have reviewed the data as listed    Latest Ref Rng & Units 10/13/2022   10:25 AM 09/13/2022   11:51 AM 07/09/2022    2:17 PM  CBC  WBC 4.0 - 10.5 K/uL 3.3  3.1  4.8   Hemoglobin 12.0 - 15.0 g/dL 10.4  11.0  10.7   Hematocrit 36.0 - 46.0 % 30.9  33.3  31.8   Platelets 150 - 400 K/uL 115  176  97        Latest Ref Rng & Units 10/13/2022   10:25 AM 08/06/2022   11:44 AM 07/09/2022    2:17 PM  CMP  Glucose 70 - 99 mg/dL 116   145   BUN 8 - 23 mg/dL 8   9   Creatinine 0.44 - 1.00 mg/dL 0.77   0.75   Sodium 135 - 145 mmol/L 141   142   Potassium 3.5 - 5.1 mmol/L 3.2   2.9   Chloride 98 - 111 mmol/L 105   98   CO2 22 - 32 mmol/L 26   31   Calcium 8.9 - 10.3 mg/dL 9.2   9.3   Total Protein 6.5 - 8.1 g/dL 6.8  7.3  7.5   Total Bilirubin 0.3 - 1.2 mg/dL 0.8  1.4  1.8   Alkaline Phos 38 - 126 U/L 77  88  154   AST 15 - 41 U/L 75  72  174   ALT 0 - 44 U/L 50  35  67     Lab Results  Component Value Date   MPROTEIN Not Observed 04/06/2022   RADIOGRAPHIC STUDIES: US BIOPSY (LIVER)  Result Date: 09/13/2022 INDICATION: Elevated Ferritin and LFT's. EXAM: Ultrasound-guided random liver biopsy MEDICATIONS: None. ANESTHESIA/SEDATION: Moderate (conscious) sedation was employed during this procedure. A total of Versed 0.5 mg and Fentanyl 25 mcg was administered intravenously by the radiology nurse. Total intra-service moderate Sedation Time:  10 minutes. The patient's level of consciousness and vital signs were monitored continuously by radiology nursing throughout the procedure under my direct supervision. COMPLICATIONS: None immediate. PROCEDURE: Informed written consent was obtained from the patient after a thorough discussion of the procedural risks,  benefits and alternatives. All questions were addressed. Maximal Sterile Barrier Technique was utilized including caps, mask, sterile gowns, sterile gloves, sterile drape, hand hygiene and skin antiseptic. A timeout was performed prior to the initiation of the procedure. Patient position supine on the ultrasound table. Epigastric skin prepped and draped in usual sterile fashion. Following local lidocaine administration, 17 gauge introducer needle was advanced into the left liver lobe, and three 18 gauge cores were obtained utilizing continuous ultrasound guidance. Gelfoam slurry was administered through the introducer needle at the biopsy site. Samples were sent to pathology in formalin. Needle removed and hemostasis achieved with 5 minutes of manual compression. Post procedure ultrasound images showed no evidence of significant hemorrhage. IMPRESSION: Ultrasound-guided random biopsy of left liver lobe. Electronically Signed   By: Miachel Roux M.D.   On: 09/13/2022 14:44    ASSESSMENT & PLAN Oscar Forman Hack 66 y.o. female with medical history significant for normocytic anemia and elevated ferritin levels who presents for a follow up visit.  #Normocytic anemia: #Thromboctyopenia -- Prior nutritional labs concerning for a folate of 3.6.  Started on p.o. folate therapy in May 2023. Repeat folate levels from 07/09/2022 was normal at 14.6.  --Labs today show evidence of count 3.3, hemoglobin 10.4, and platelets of   115.  Overall stable anemia with improvement of thrombocytopenia.  --Suspect low platelets due to liver dysfunction.  Patient currently connected with Dr. Candis Schatz in  gastroenterology. --Likely anemia of chronic disease.   #Elevated ferritin levels: --No evidence of hereditary hemochromatosis based on DNA testing completed on 02/16/2022 --Suspect liver disease due to alcoholic abuse and elevated LFTs --US liver biopsy was completed on 09/13/2022. Pathology revealed advanced fibrosis without any evidence of iron overload.   Follow up: --RTC PRN or if cytopenias worsen.   No orders of the defined types were placed in this encounter.   All questions were answered. The patient knows to call the clinic with any problems, questions or concerns.  I have spent a total of 30 minutes minutes of face-to-face and non-face-to-face time, preparing to see the patient, performing a medically appropriate examination, counseling and educating the patient, documenting clinical information in the electronic health record, and care coordination.   Dede Query PA-C Dept of Hematology and [place] at [facility] Phone: [phone]   10/13/2022 12:22 PM

## 2022-10-18 ENCOUNTER — Encounter: Payer: Self-pay | Admitting: Gastroenterology

## 2022-10-18 ENCOUNTER — Other Ambulatory Visit (INDEPENDENT_AMBULATORY_CARE_PROVIDER_SITE_OTHER): Payer: Medicare HMO

## 2022-10-18 ENCOUNTER — Telehealth: Payer: Self-pay | Admitting: Gastroenterology

## 2022-10-18 ENCOUNTER — Ambulatory Visit: Payer: Medicare HMO | Admitting: Gastroenterology

## 2022-10-18 VITALS — BP 118/86 | HR 106 | Ht 65.0 in | Wt 98.0 lb

## 2022-10-18 DIAGNOSIS — K709 Alcoholic liver disease, unspecified: Secondary | ICD-10-CM

## 2022-10-18 LAB — GAMMA GT: GGT: 330 U/L — ABNORMAL HIGH (ref 7–51)

## 2022-10-18 NOTE — Telephone Encounter (Signed)
Patient called states she's wondering if she needs to keep taking fish oil or not. Requesting a call back

## 2022-10-18 NOTE — Progress Notes (Unsigned)
HPI : Leah Woodward is a very pleasant 66 year old female with compensated alcoholic cirrhosis who presents for follow-up.  Since her last visit 2 months ago, she underwent elastography which was consistent with advanced liver disease (median K PA 9.7), and also underwent a liver biopsy ordered by her hematologist which showed advanced scarring, mild steatohepatitis, Mallory Weiss bodies, most consistent with alcoholic liver disease.  There was no intrahepatic iron deposition. She was prescribed naltrexone to help with her alcohol addiction.    Today, the patient reports she is feeling well.  She has been taking her naltrexone as needed for when she has cravings, as opposed to daily as instructed.  She reports that she continues to drink, although less frequently.  She reports having a daiquiri with 1 shot of rum once or twice a week.  Overall, she is proud of herself for her ability to reduce alcohol consumption.  She reports that she recently went to her brother's wedding and did not drink any alcohol despite everyone else around her drinking. She continues to have issues with low appetite, eating usually only 1 meal a day.  She drinks a boost every morning and drinks juice in the evenings, but typically does not eat anything more than a small snack except dinner. She denies any problems with abdominal pain, nausea or vomiting.  She has regular bowel movements, no issues with constipation or diarrhea.  She reports taking an energy pill which she gets at the [employer].  She does not know the name of it or what is in it.  She is also taking fish oil and a multivitamin.    Previous GI Evaluation EGD showed a Hill grade 4 valve, but was otherwise normal.  Biopsies of her stomach and duodenum showed no evidence of H. Pylori.    Colonoscopy was notable only for a few small hyperplastic polyps and a 12 mm rectal tubular adenoma.  She was recommended to repeat colonoscopy in 3 years.    She  has had a marked hyperferritinenemia, as high as 3004 months ago.  Most recent ferritin was 1900.  Genetic testing for hemochromatosis was negative    US/Elastography:  Sept 25, 2023 Echogenic parenchyma, slightly nodular contours, GB without stones or wall thickening Median kPa: 9.7 c/w advanced chronic liver disease   History reviewed. No pertinent past medical history.   Past Surgical History:  Procedure Laterality Date   APPENDECTOMY     Family History  Problem Relation Age of Onset   Breast cancer Neg Hx    Colon cancer Neg Hx    Stomach cancer Neg Hx    Esophageal cancer Neg Hx    Colon polyps Neg Hx    Social History   Tobacco Use   Smoking status: Every Day   Smokeless tobacco: Never  Vaping Use   Vaping Use: Never used  Substance Use Topics   Alcohol use: Yes    Alcohol/week: 2.0 standard drinks of alcohol    Types: 2 Shots of liquor per week   Drug use: Never   Current Outpatient Medications  Medication Sig Dispense Refill   folic acid (FOLVITE) 1 MG tablet TAKE 1 TABLET(1 MG) BY MOUTH DAILY 30 tablet 3   naltrexone (DEPADE) 50 MG tablet Take 1 tablet (50 mg total) by mouth daily. 90 tablet 3   Current Facility-Administered Medications  Medication Dose Route Frequency Provider Last Rate Last Admin   ondansetron (ZOFRAN-ODT) disintegrating tablet 4 mg  4 mg Oral Once Brimage,  Vondra, DO       No Known Allergies   Review of Systems: All systems reviewed and negative except where noted in HPI.    No results found.  Physical Exam: Ht '5\' 5"'$  (1.651 m)   Wt 98 lb (44.5 kg)   BMI 16.31 kg/m  Constitutional: Pleasant,well-developed, ***female in no acute distress. HEENT: Normocephalic and atraumatic. Conjunctivae are normal. No scleral icterus. Neck supple.  Cardiovascular: Normal rate, regular rhythm.  Pulmonary/chest: Effort normal and breath sounds normal. No wheezing, rales or rhonchi. Abdominal: Soft, nondistended, nontender. Bowel sounds active  throughout. There are no masses palpable. No hepatomegaly. Extremities: no edema Lymphadenopathy: No cervical adenopathy noted. Neurological: Alert and oriented to person place and time. Skin: Skin is warm and dry. No rashes noted. Psychiatric: Normal mood and affect. Behavior is normal.  CBC    Component Value Date/Time   WBC 3.3 (L) 10/13/2022 1025   WBC 3.1 (L) 09/13/2022 1151   RBC 3.67 (L) 10/13/2022 1025   HGB 10.4 (L) 10/13/2022 1025   HGB 8.8 (L) 03/22/2022 1208   HCT 30.9 (L) 10/13/2022 1025   HCT 28.0 (L) 03/22/2022 1208   PLT 115 (L) 10/13/2022 1025   PLT 194 03/22/2022 1208   MCV 84.2 10/13/2022 1025   MCV 96 03/22/2022 1208   MCH 28.3 10/13/2022 1025   MCHC 33.7 10/13/2022 1025   RDW 14.9 10/13/2022 1025   RDW 14.0 03/22/2022 1208   LYMPHSABS 1.1 10/13/2022 1025   LYMPHSABS 1.6 01/04/2022 1553   MONOABS 0.3 10/13/2022 1025   EOSABS 0.0 10/13/2022 1025   EOSABS 0.0 01/04/2022 1553   BASOSABS 0.0 10/13/2022 1025   BASOSABS 0.0 01/04/2022 1553    CMP     Component Value Date/Time   NA 141 10/13/2022 1025   NA 143 01/04/2022 1553   K 3.2 (L) 10/13/2022 1025   CL 105 10/13/2022 1025   CO2 26 10/13/2022 1025   GLUCOSE 116 (H) 10/13/2022 1025   BUN 8 10/13/2022 1025   BUN 7 (L) 01/04/2022 1553   CREATININE 0.77 10/13/2022 1025   CALCIUM 9.2 10/13/2022 1025   PROT 6.8 10/13/2022 1025   PROT 6.6 01/04/2022 1553   ALBUMIN 4.0 10/13/2022 1025   ALBUMIN 4.2 01/04/2022 1553   AST 75 (H) 10/13/2022 1025   ALT 50 (H) 10/13/2022 1025   ALKPHOS 77 10/13/2022 1025   BILITOT 0.8 10/13/2022 1025   GFRNONAA >60 10/13/2022 1025   GFRAA 71 12/09/2020 1610   Component Ref Range & Units 5 d ago 2 mo ago 3 mo ago 6 mo ago 8 mo ago  Ferritin 11 - 307 ng/mL 1,887 High  >1500.0 High  R 1,935 High  CM 3,275          Component Ref Range & Units 5 d ago 6 mo ago 8 mo ago  Iron 28 - 170 ug/dL 300 High  328 High  272 High  R  TIBC 250 - 450 ug/dL 304 373 282 R  Saturation  Ratios 10.4 - 31.8 % 99 High  88 High    UIBC 148 - 442 ug/dL 4 Low  45 R, CM      ASSESSMENT AND PLAN:  Stop energy pill Naltrexone daily   Erskine Emery, MD

## 2022-10-18 NOTE — Patient Instructions (Signed)
_______________________________________________________  If you are age 67 or older, your body mass index should be between 23-30. Your Body mass index is 16.31 kg/m. If this is out of the aforementioned range listed, please consider follow up with your Primary Care Provider.  If you are age 65 or younger, your body mass index should be between 19-25. Your Body mass index is 16.31 kg/m. If this is out of the aformentioned range listed, please consider follow up with your Primary Care Provider.   Your provider has requested that you go to the basement level for lab work before leaving today. Press "B" on the elevator. The lab is located at the first door on the left as you exit the elevator.  Take Naltrexone daily   Due to recent changes in healthcare laws, you may see the results of your imaging and laboratory studies on MyChart before your provider has had a chance to review them.  We understand that in some cases there may be results that are confusing or concerning to you. Not all laboratory results come back in the same time frame and the provider may be waiting for multiple results in order to interpret others.  Please give [place] 48 hours in order for your provider to thoroughly review all the results before contacting the office for clarification of your results.   The  GI providers would like to encourage you to use [facility] to communicate with providers for non-urgent requests or questions.  Due to long hold times on the telephone, sending your provider a message by [facility] may be a faster and more efficient way to get a response.  Please allow 48 business hours for a response.  Please remember that this is for non-urgent requests.   It was a pleasure to see you today!  Thank you for trusting me with your gastrointestinal care!    Scott E.Candis Schatz, MD

## 2022-10-19 NOTE — Telephone Encounter (Signed)
Contacted patient and informed her she could take fish oil but should NOT take energy supplement.

## 2022-10-21 NOTE — Progress Notes (Signed)
Leah Woodward,  Can you please let Leah Woodward know that her labs showed significant inflammation of the liver, indicating ongoing damage in the setting of persistent alcohol consumption.  Plan to recheck in 3 months as patient continues to try to abstain from alcohol completely

## 2022-10-22 ENCOUNTER — Other Ambulatory Visit: Payer: Self-pay

## 2022-10-22 DIAGNOSIS — R7989 Other specified abnormal findings of blood chemistry: Secondary | ICD-10-CM

## 2022-10-22 DIAGNOSIS — K709 Alcoholic liver disease, unspecified: Secondary | ICD-10-CM

## 2022-11-05 ENCOUNTER — Other Ambulatory Visit: Payer: Self-pay

## 2022-11-05 MED ORDER — FOLIC ACID 1 MG PO TABS: ORAL_TABLET | ORAL | 3 refills | Status: DC

## 2022-11-05 NOTE — Telephone Encounter (Signed)
Inbound call from patient stating she needs refill on her folic acid medication? Please advise.  Thank you

## 2022-11-05 NOTE — Telephone Encounter (Signed)
Previous folic acid script sent in by another provider. Please advise if ok to send in script for pt for this.

## 2022-11-05 NOTE — Telephone Encounter (Signed)
Left pt a detailed message regarding refill.

## 2022-12-31 ENCOUNTER — Other Ambulatory Visit: Payer: Self-pay | Admitting: Physician Assistant

## 2022-12-31 NOTE — Telephone Encounter (Signed)
This as sent in on 11/05/22 by another provider with 3 refills

## 2023-01-09 ENCOUNTER — Other Ambulatory Visit: Payer: Self-pay | Admitting: Physician Assistant

## 2023-09-14 IMAGING — MG MM DIGITAL SCREENING BILAT W/ TOMO AND CAD
8 series · 9 of 24 positions shown · non-contrast
Comparison: None.

CLINICAL DATA: Screening.

EXAM:
DIGITAL SCREENING BILATERAL MAMMOGRAM WITH TOMOSYNTHESIS AND CAD
TECHNIQUE: Bilateral screening digital craniocaudal and mediolateral oblique
mammograms were obtained. Bilateral screening digital breast
tomosynthesis was performed. The images were evaluated with
computer-aided detection.

[L CC synth-2D]
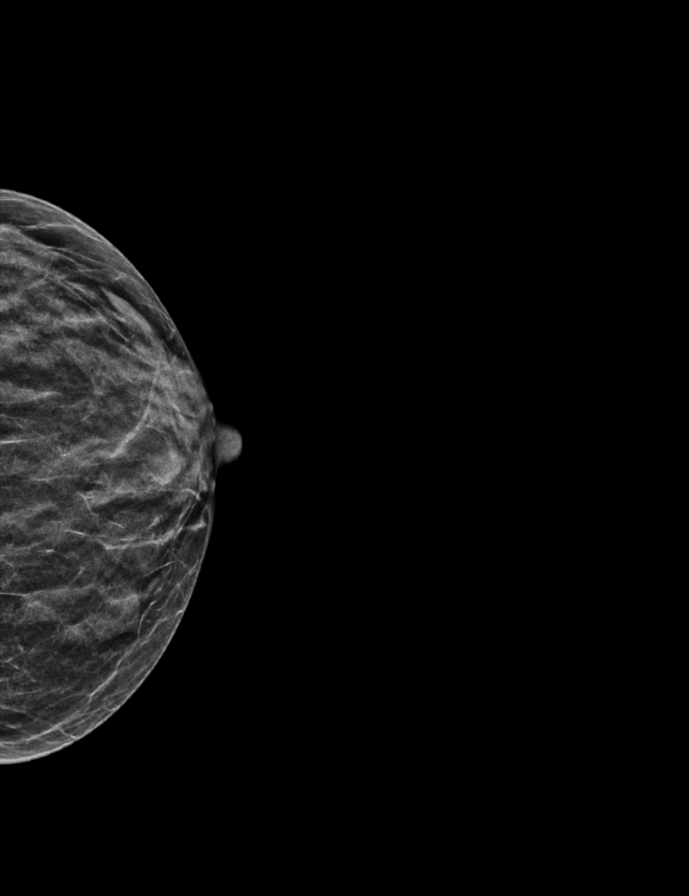

[R MLO synth-2D]
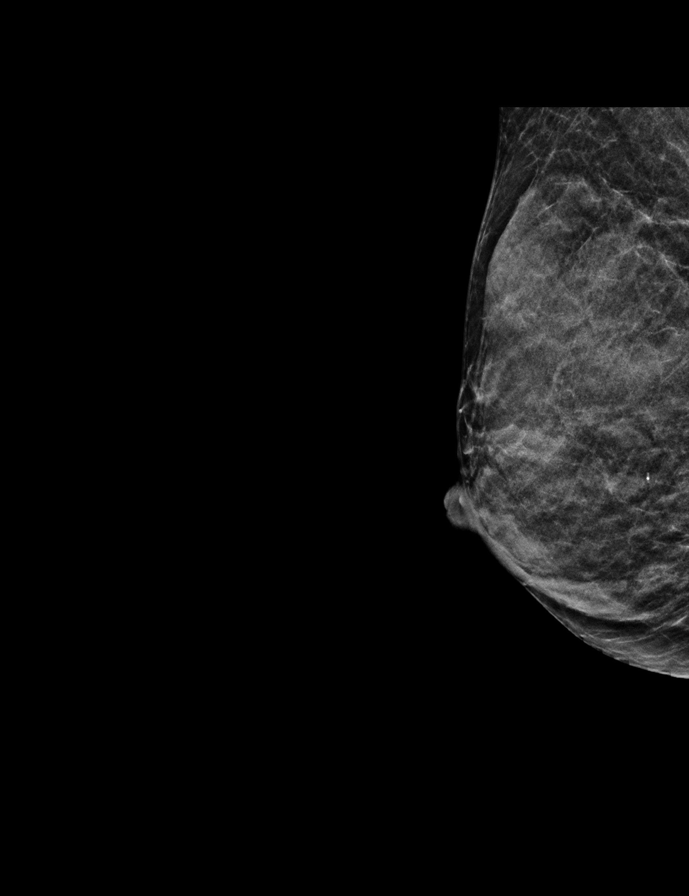

[L MLO synth-2D]
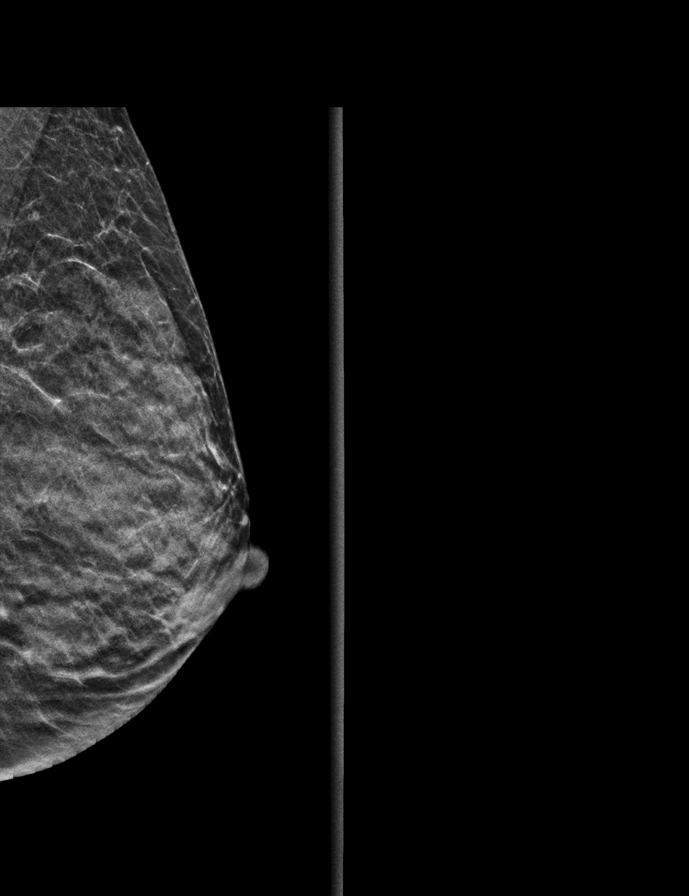

[R CC synth-2D]
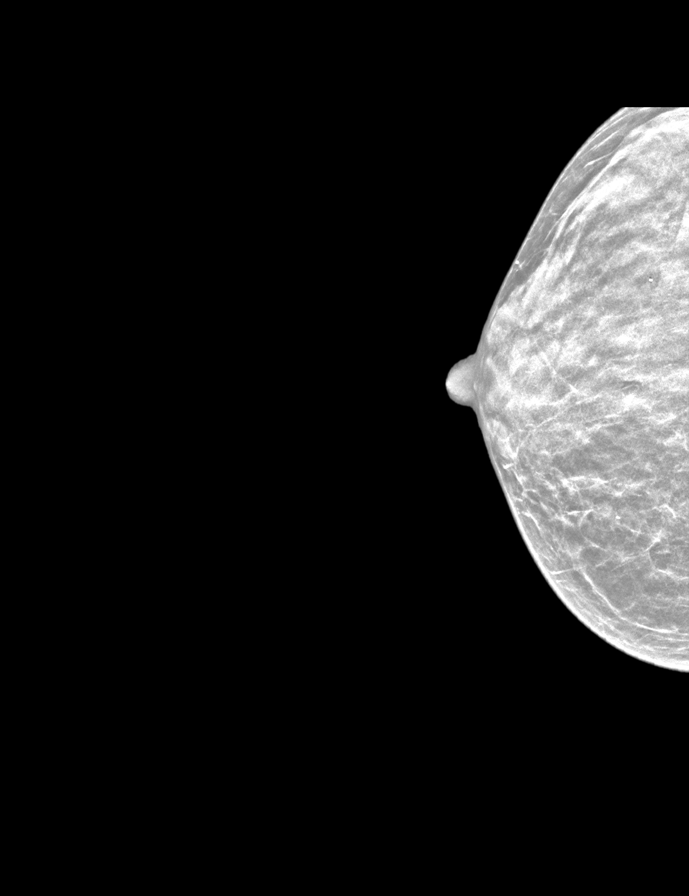

[R CC tomo · 2 of 30 frames shown]
[frame 10/30]
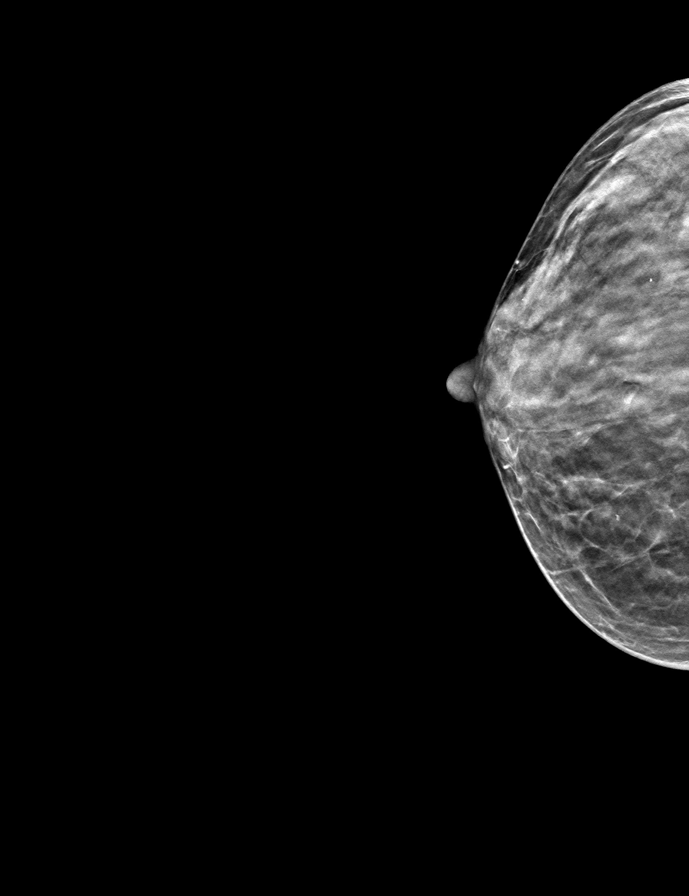
[frame 15/30]
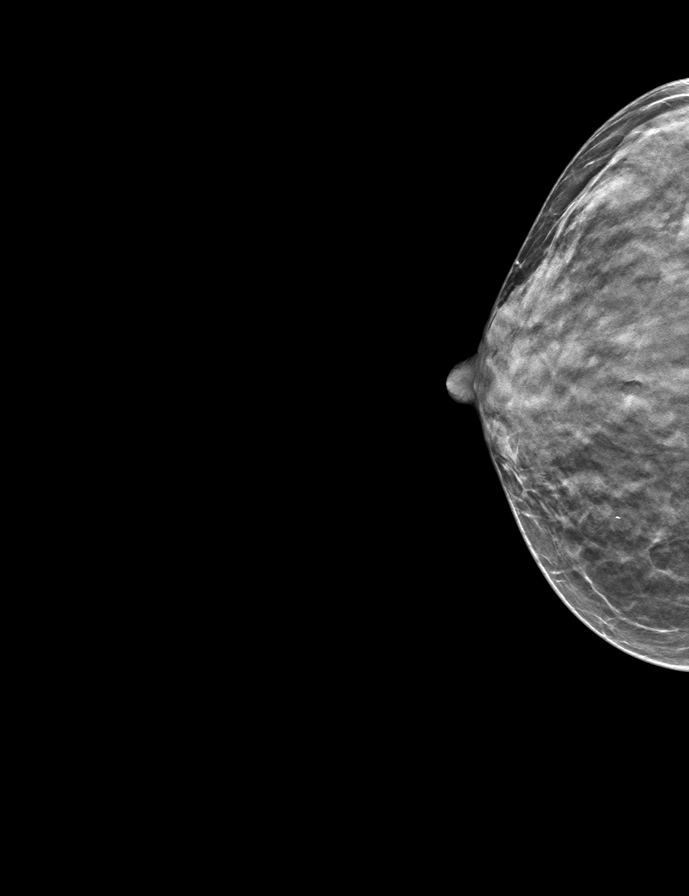

[L CC tomo · tomo slice 16/31.0]
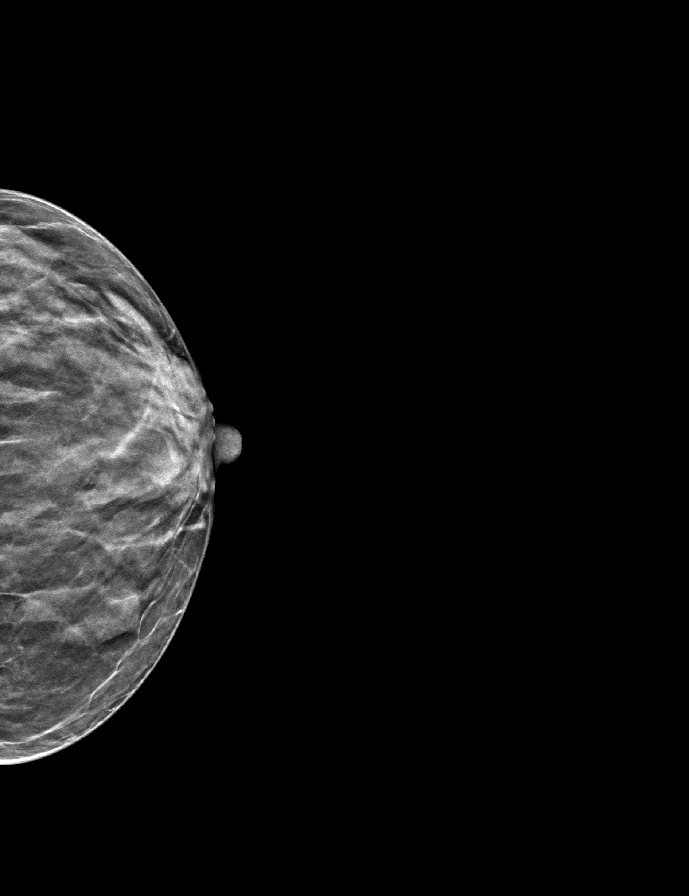

[L MLO tomo · tomo slice 15/28.0]
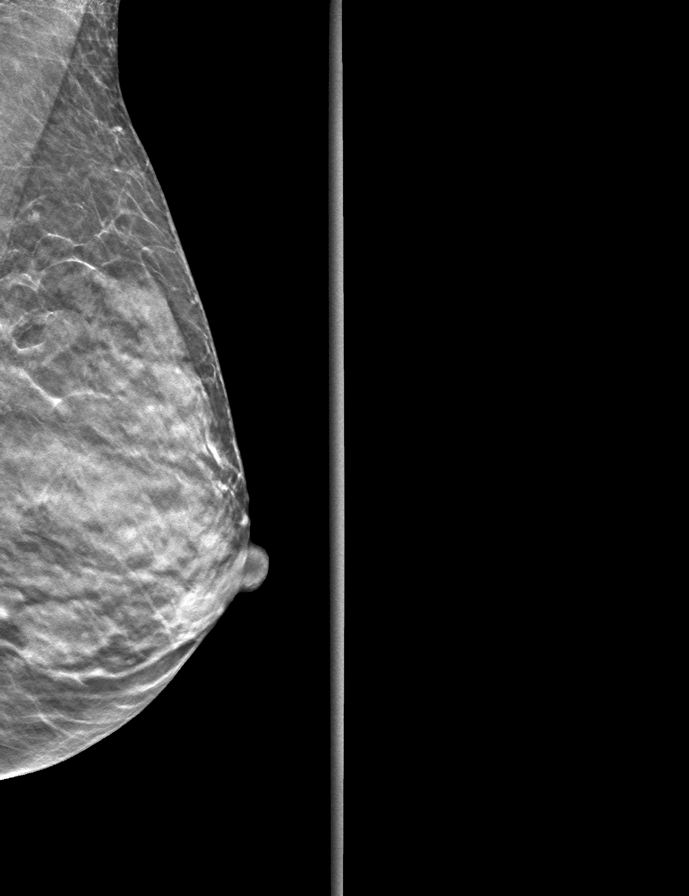

[R MLO tomo · tomo slice 15/30.0]
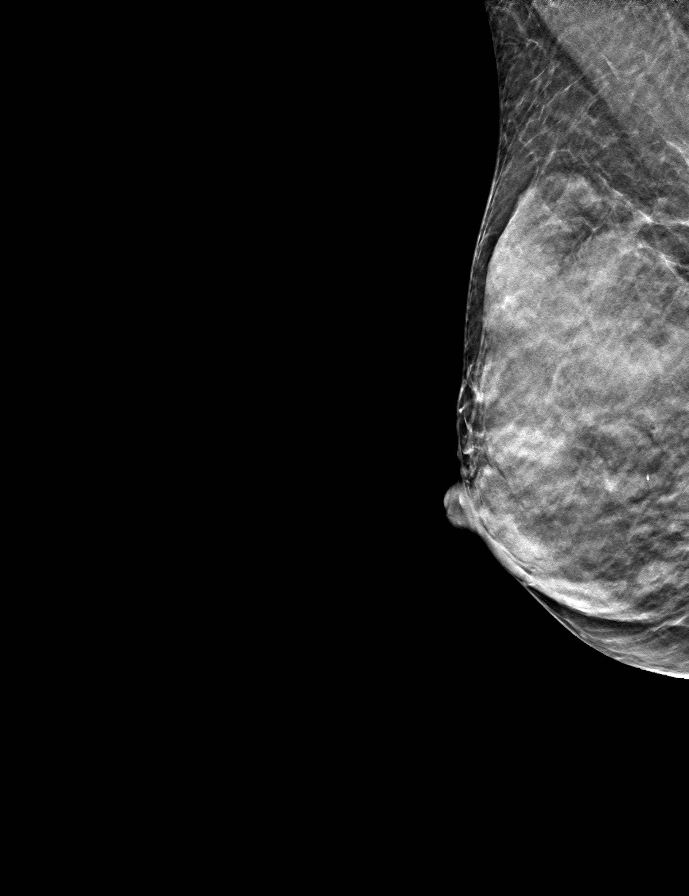

[9 of 24 positions shown; findings below may reference images not displayed]

ACR Breast Density Category d: The breast tissue is extremely dense,
which lowers the sensitivity of mammography.
FINDINGS: There are no findings suspicious for malignancy.
IMPRESSION: No mammographic evidence of malignancy. A result letter of this
screening mammogram will be mailed directly to the patient.

RECOMMENDATION:
Screening mammogram in one year. (Code:W3-Z-XIN)

BI-RADS CATEGORY  1: Negative.

## 2023-09-19 IMAGING — US US ABDOMEN LIMITED
1 series · 13 of 25 positions shown · non-contrast
Comparison: No prior.

CLINICAL DATA: Alcohol abuse.  Unintended weight loss.

EXAM:
ULTRASOUND ABDOMEN LIMITED RIGHT UPPER QUADRANT

[Series 1: us abdomen limited · 0.14mm/px · 13 of 51 slices shown]
[im 1/51]
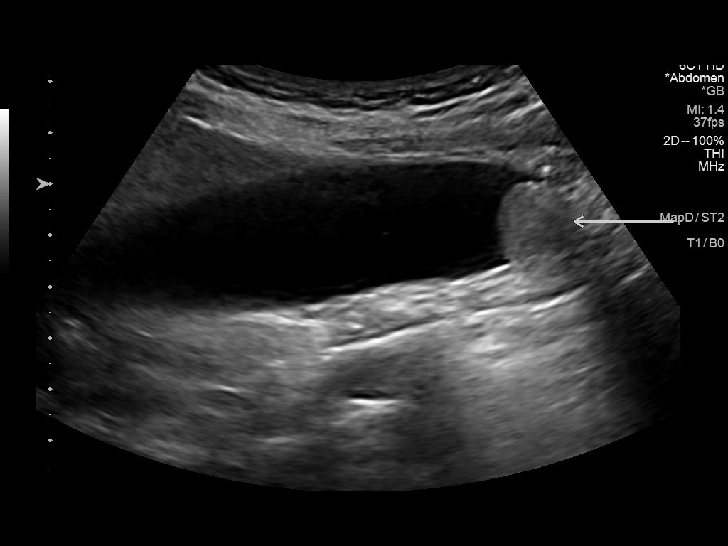
[im 5/51]
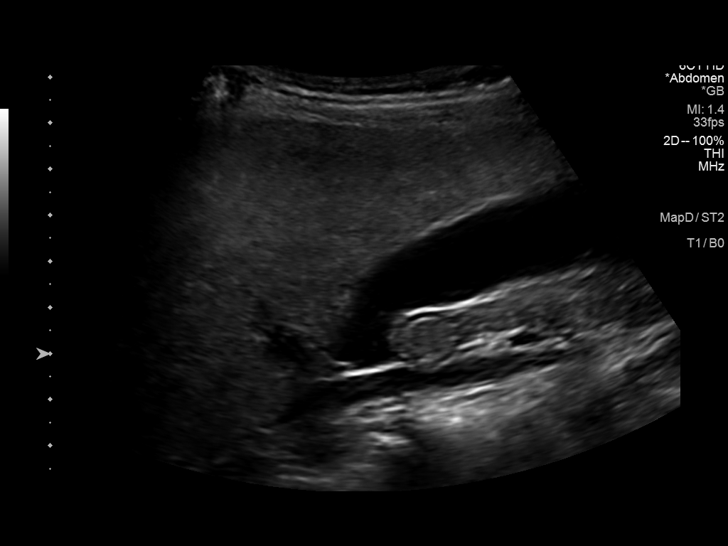
[im 9/51]
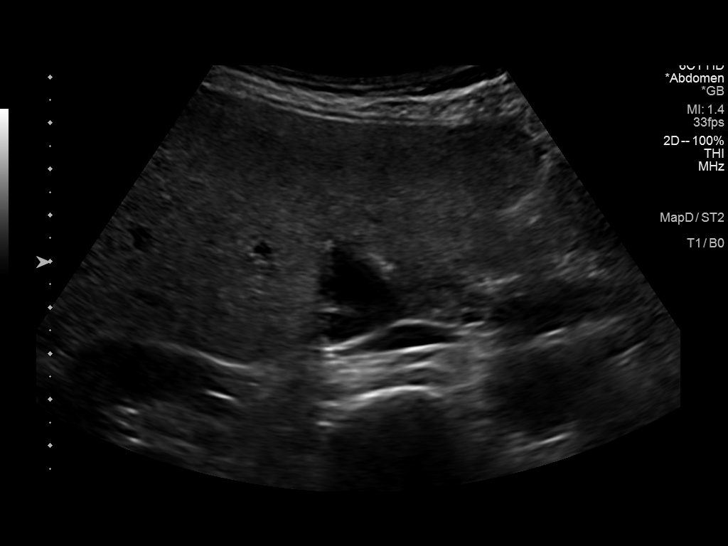
[im 13/51]
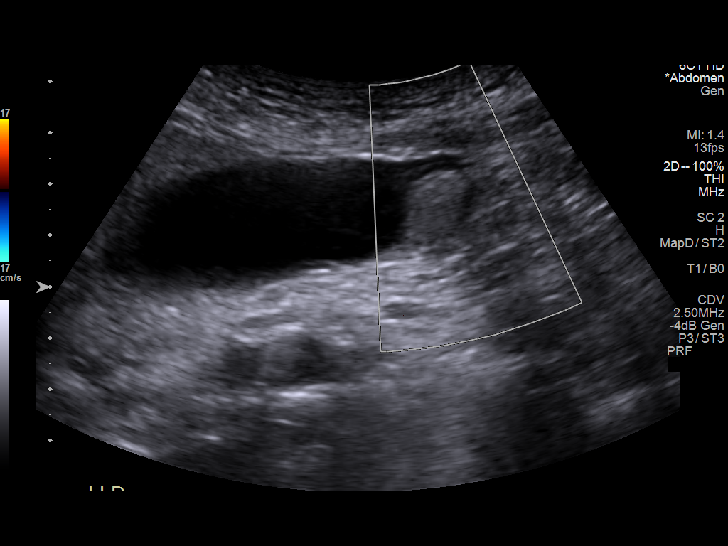
[im 17/51]
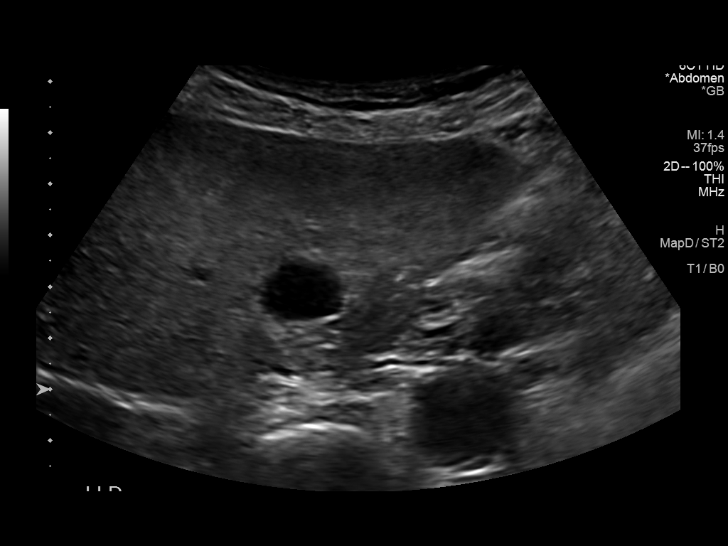
[im 21/51]
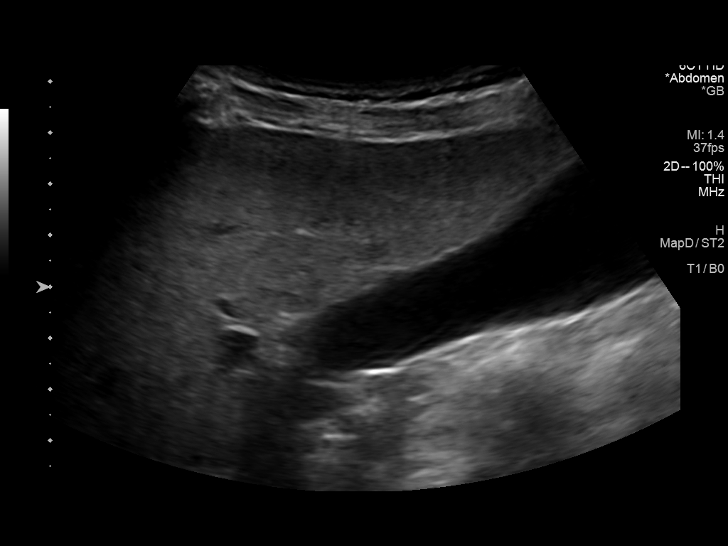
[im 26/51]
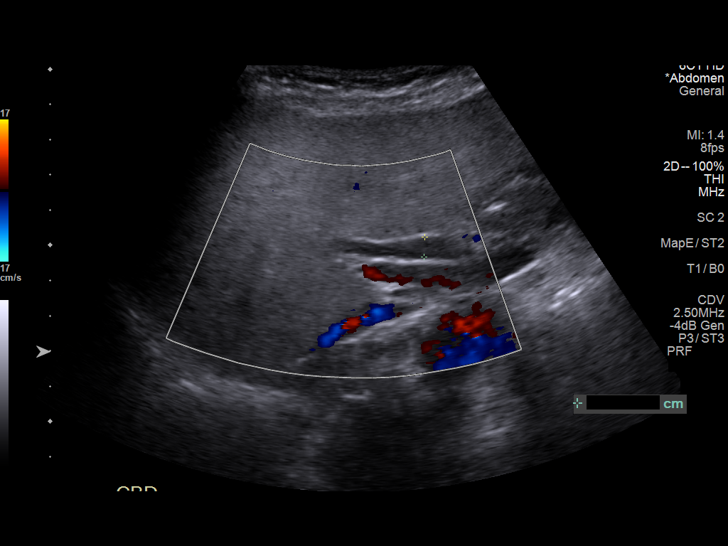
[im 30/51]
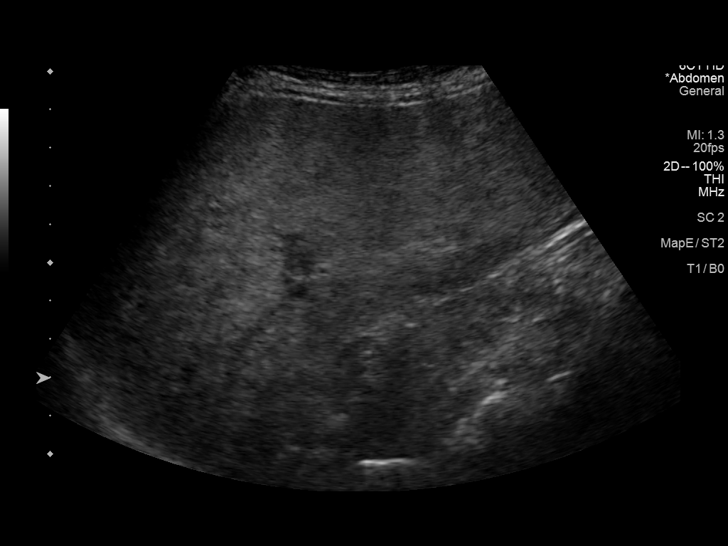
[im 34/51]
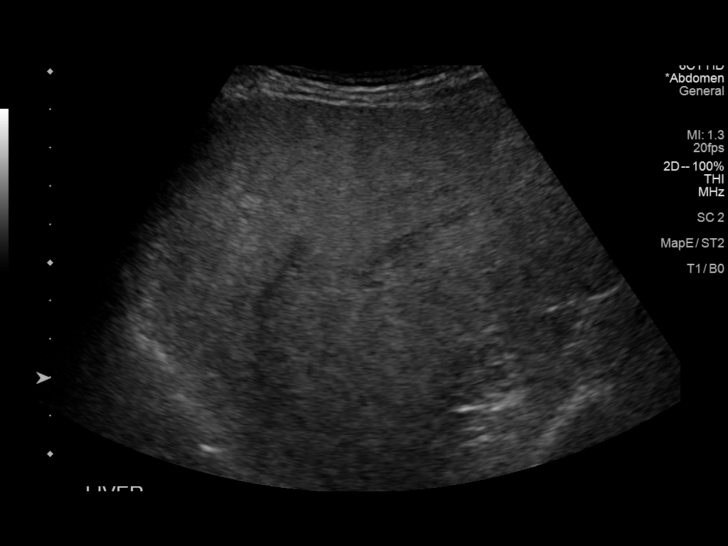
[im 38/51]
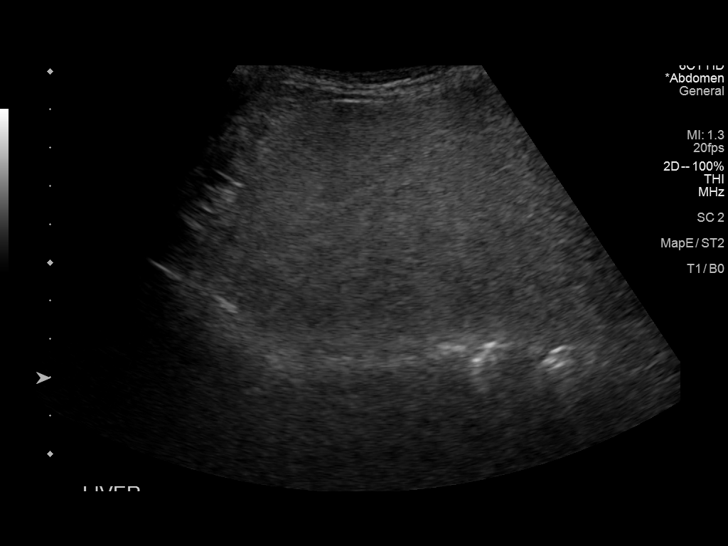
[im 42/51]
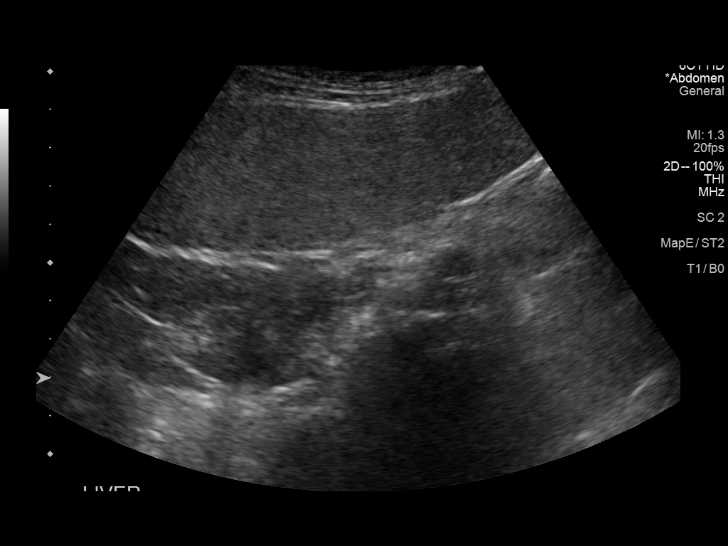
[im 46/51]
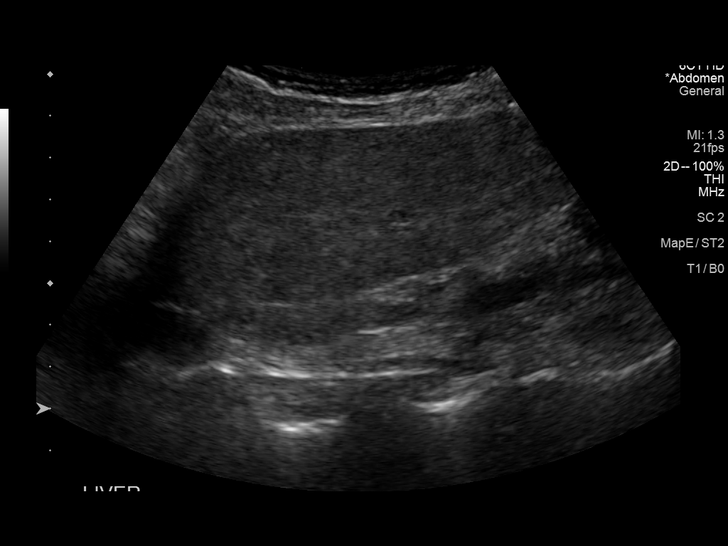
[im 51/51]
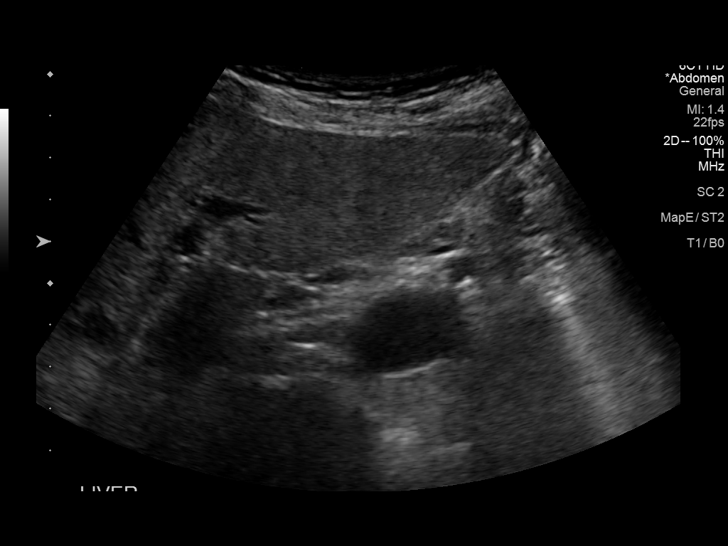

[13 of 25 positions shown; findings below may reference images not displayed]

FINDINGS: Gallbladder:

A non mobile soft tissue mass is noted in the fundus of the
gallbladder. Tiny punctate echogenicities within the mass may
represent tiny foci of calcifications. This may represent non mobile
tumefactive sludge or a gallbladder tumor. No gallstones.
Gallbladder wall thickness normal. Negative Murphy sign.

Common bile duct:

Diameter: 5.7 mm.

Liver:

Dense heterogeneous hepatic parenchymal pattern consistent with
fatty infiltration and or hepatocellular disease. No focal hepatic
abnormality identified. Portal vein is patent on color Doppler
imaging with normal direction of blood flow towards the liver.

Other: None.
IMPRESSION: 1. A non mobile soft tissue mass is noted in the fundus of the
gallbladder. Tiny punctate calcifications may be present within the
mass. This mass may represent non mobile tumefactive sludge or a
gallbladder tumor. Further evaluation of the abdomen can be obtained
with MRI. No gallstones or biliary distention noted.

2. Dense heterogeneous hepatic parenchymal pattern consistent with
fatty infiltration and or hepatocellular disease. No focal hepatic
abnormality identified.

## 2023-11-05 ENCOUNTER — Other Ambulatory Visit: Payer: Self-pay | Admitting: Gastroenterology

## 2024-01-16 ENCOUNTER — Other Ambulatory Visit: Payer: Self-pay | Admitting: Registered Nurse

## 2024-01-16 DIAGNOSIS — R911 Solitary pulmonary nodule: Secondary | ICD-10-CM

## 2024-03-01 ENCOUNTER — Other Ambulatory Visit: Payer: Self-pay | Admitting: Gastroenterology

## 2024-03-08 ENCOUNTER — Ambulatory Visit (INDEPENDENT_AMBULATORY_CARE_PROVIDER_SITE_OTHER): Admitting: Podiatry

## 2024-03-08 DIAGNOSIS — Z91199 Patient's noncompliance with other medical treatment and regimen due to unspecified reason: Secondary | ICD-10-CM

## 2024-03-11 NOTE — Progress Notes (Signed)
 Patient was no-show for appointment today

## 2024-03-15 ENCOUNTER — Ambulatory Visit (INDEPENDENT_AMBULATORY_CARE_PROVIDER_SITE_OTHER)

## 2024-03-15 ENCOUNTER — Encounter: Payer: Self-pay | Admitting: Podiatry

## 2024-03-15 ENCOUNTER — Ambulatory Visit (INDEPENDENT_AMBULATORY_CARE_PROVIDER_SITE_OTHER): Admitting: Podiatry

## 2024-03-15 DIAGNOSIS — M21611 Bunion of right foot: Secondary | ICD-10-CM

## 2024-03-15 DIAGNOSIS — M2011 Hallux valgus (acquired), right foot: Secondary | ICD-10-CM | POA: Diagnosis not present

## 2024-03-15 DIAGNOSIS — M21612 Bunion of left foot: Secondary | ICD-10-CM

## 2024-03-15 DIAGNOSIS — M21619 Bunion of unspecified foot: Secondary | ICD-10-CM | POA: Diagnosis not present

## 2024-03-15 DIAGNOSIS — M2012 Hallux valgus (acquired), left foot: Secondary | ICD-10-CM

## 2024-03-15 NOTE — Progress Notes (Signed)
  Subjective:  Patient ID: Leah Woodward, female    DOB: 1956-11-01,  MRN: [id number]  Chief Complaint  Patient presents with   Bunions    Patient states she has bilateral bunions on bilateral feet, patient states that they cause her pain and discomfort, they come and go patient states, patient takes tylenol sometimes for pain    Discussed the use of AI scribe software for clinical note transcription with the patient, who gave verbal consent to proceed.  History of Present Illness Leah Woodward is a 68 year old female who presents with painful bunions.  She experiences painful bunions with intermittent pain, with some days being pain-free and others involving significant discomfort. The pain is localized to the bunion area.  She has difficulty finding comfortable shoes, noting that wearing tennis shoes is painful. She typically purchases shoes from [place] and struggles to find half sizes, complicating the fit. She wears a size seven but often cannot find a seven and a half, which she believes would be more comfortable.  She smokes approximately three cigarettes a day, which is a reduction from her previous smoking habits.      Objective:    Physical Exam VASCULAR: Weakly palpable pulses in feet. Foot is warm and well-perfused. Capillary fill time is brisk. DERMATOLOGIC: Foot is very dry. Normal skin turgor, texture, and temperature. No open lesions, rashes, or ulcerations. NEUROLOGIC: Normal sensation to light touch and pressure. No paresthesias on examination. ORTHOPEDIC: Hallux valgus deformity with dorsal medial bunions. Smooth pain-free range of motion of all examined joints. Slight tenderness under the sesamoid. No ecchymosis or bruising. No gross deformity.   No images are attached to the encounter.    Results RADIOLOGY Radiographs of both feet: Hallux valgus deformity with mild degenerative changes in the joint (03/15/2024)   Assessment:   1. Bunion    2. Hallux valgus with bunions, left   3. Hallux valgus with bunions, right      Plan:  Patient was evaluated and treated and all questions answered.  Assessment and Plan Assessment & Plan Hallux Valgus with Bunions Intermittent pain in the bunion area, exacerbated by improper footwear. The bunions are hereditary and involve normal bone in an abnormal position with some bone growth. Mild arthritis in the first metatarsophalangeal joint may contribute to the pain. Radiographs show hallux valgus deformity with mild degenerative changes. Surgery is the definitive treatment but involves significant recovery time and potential complications, especially with smoking. Conservative management with proper footwear and bunion shields is recommended. Surgery involves osteotomy, shifting, and realigning the bone with screws, requiring two weeks of non-weight bearing and six weeks in a walking boot, totaling a two to three month recovery period. Smoking increases the risk of complications such as infection or improper bone healing. Steroid injections may be considered if conservative measures fail. - Recommend wearing properly fitting shoes with adequate width and room. - Advise using bunion shields with toe spacers to alleviate pressure and pain. - Consider steroid injections for arthritis pain if conservative measures fail. - Avoid surgery due to smoking and potential complications.  Smoking She smokes approximately three cigarettes per day. Smoking can increase the risk of complications in surgical procedures, such as infections or improper bone healing. - Advise on the risks of smoking, especially related to surgical outcomes.      Return if symptoms worsen or fail to improve.

## 2024-03-15 NOTE — Patient Instructions (Signed)
 VISIT SUMMARY:  Today, we discussed the pain you are experiencing from your bunions. We reviewed your current symptoms, including the difficulty you have finding comfortable shoes and the intermittent nature of your pain. We also talked about your smoking habits and how they might affect potential treatments.  YOUR PLAN:  -HALLUX VALGUS WITH BUNIONS: Hallux valgus with bunions is a condition where the big toe deviates towards the other toes, causing a bony bump on the side of the foot. This can lead to pain, especially with improper footwear. We recommend wearing properly fitting shoes with adequate width and room, and using bunion shields with toe spacers to alleviate pressure and pain. If these measures do not help, we may consider steroid injections for arthritis pain. Surgery is not recommended at this time due to the potential complications associated with smoking.  -SMOKING: Smoking can increase the risk of complications in surgical procedures, such as infections or improper bone healing. We discussed the risks of smoking, especially related to surgical outcomes, and the importance of reducing or quitting smoking to improve your overall health.  INSTRUCTIONS:  Please follow the recommendations for wearing properly fitting shoes and using bunion shields with toe spacers. If your pain persists, we may consider steroid injections. Additionally, consider reducing or quitting smoking to improve your overall health and reduce potential complications in the future. Follow up with [place] if you have any concerns or if your symptoms worsen.

## 2024-03-31 ENCOUNTER — Emergency Department (HOSPITAL_COMMUNITY)
Admission: EM | Admit: 2024-03-31 | Discharge: 2024-03-31 | Disposition: A | Discharge status: Home / Self Care | Attending: Emergency Medicine | Admitting: Emergency Medicine

## 2024-03-31 ENCOUNTER — Emergency Department (HOSPITAL_COMMUNITY)

## 2024-03-31 ENCOUNTER — Other Ambulatory Visit: Payer: Self-pay

## 2024-03-31 ENCOUNTER — Encounter (HOSPITAL_COMMUNITY): Payer: Self-pay

## 2024-03-31 DIAGNOSIS — Z122 Encounter for screening for malignant neoplasm of respiratory organs: Secondary | ICD-10-CM | POA: Insufficient documentation

## 2024-03-31 DIAGNOSIS — R600 Localized edema: Secondary | ICD-10-CM | POA: Diagnosis not present

## 2024-03-31 DIAGNOSIS — I10 Essential (primary) hypertension: Secondary | ICD-10-CM | POA: Diagnosis not present

## 2024-03-31 DIAGNOSIS — F1010 Alcohol abuse, uncomplicated: Secondary | ICD-10-CM | POA: Insufficient documentation

## 2024-03-31 DIAGNOSIS — Y901 Blood alcohol level of 20-39 mg/100 ml: Secondary | ICD-10-CM | POA: Diagnosis not present

## 2024-03-31 DIAGNOSIS — R609 Edema, unspecified: Secondary | ICD-10-CM

## 2024-03-31 DIAGNOSIS — Z79899 Other long term (current) drug therapy: Secondary | ICD-10-CM | POA: Diagnosis not present

## 2024-03-31 DIAGNOSIS — R0602 Shortness of breath: Secondary | ICD-10-CM | POA: Diagnosis not present

## 2024-03-31 DIAGNOSIS — E860 Dehydration: Secondary | ICD-10-CM | POA: Insufficient documentation

## 2024-03-31 DIAGNOSIS — R531 Weakness: Secondary | ICD-10-CM | POA: Diagnosis present

## 2024-03-31 DIAGNOSIS — R000 Tachycardia, unspecified: Secondary | ICD-10-CM | POA: Insufficient documentation

## 2024-03-31 HISTORY — DX: Hemochromatosis, unspecified: E83.119

## 2024-03-31 HISTORY — DX: Essential (primary) hypertension: I10

## 2024-03-31 HISTORY — DX: Depression, unspecified: F32.A

## 2024-03-31 LAB — I-STAT CHEM 8, ED
BUN: 8 mg/dL (ref 8–23)
Calcium, Ion: 1.14 mmol/L — ABNORMAL LOW (ref 1.15–1.40)
Chloride: 97 mmol/L — ABNORMAL LOW (ref 98–111)
Creatinine, Ser: 1 mg/dL (ref 0.44–1.00)
Glucose, Bld: 96 mg/dL (ref 70–99)
HCT: 36 % (ref 36.0–46.0)
Hemoglobin: 12.2 g/dL (ref 12.0–15.0)
Potassium: 3 mmol/L — ABNORMAL LOW (ref 3.5–5.1)
Sodium: 136 mmol/L (ref 135–145)
TCO2: 25 mmol/L (ref 22–32)

## 2024-03-31 LAB — COMPREHENSIVE METABOLIC PANEL WITH GFR
ALT: 20 U/L (ref 0–44)
AST: 29 U/L (ref 15–41)
Albumin: 3.6 g/dL (ref 3.5–5.0)
Alkaline Phosphatase: 68 U/L (ref 38–126)
Anion gap: 18 — ABNORMAL HIGH (ref 5–15)
BUN: 8 mg/dL (ref 8–23)
CO2: 24 mmol/L (ref 22–32)
Calcium: 9.9 mg/dL (ref 8.9–10.3)
Chloride: 96 mmol/L — ABNORMAL LOW (ref 98–111)
Creatinine, Ser: 0.82 mg/dL (ref 0.44–1.00)
GFR, Estimated: >60 mL/min (ref >60)
Glucose, Bld: 106 mg/dL — ABNORMAL HIGH (ref 70–99)
Potassium: 3.1 mmol/L — ABNORMAL LOW (ref 3.5–5.1)
Sodium: 138 mmol/L (ref 135–145)
Total Bilirubin: 1.4 mg/dL — ABNORMAL HIGH (ref 0.0–1.2)
Total Protein: 7.6 g/dL (ref 6.5–8.1)

## 2024-03-31 LAB — CBC
HCT: 31.6 % — ABNORMAL LOW (ref 36.0–46.0)
Hemoglobin: 10 g/dL — ABNORMAL LOW (ref 12.0–15.0)
MCH: 29.8 pg (ref 26.0–34.0)
MCHC: 31.6 g/dL (ref 30.0–36.0)
MCV: 94 fL (ref 80.0–100.0)
Platelets: 123 10*3/uL — ABNORMAL LOW (ref 150–400)
RBC: 3.36 MIL/uL — ABNORMAL LOW (ref 3.87–5.11)
RDW: 14.7 % (ref 11.5–15.5)
WBC: 8.1 10*3/uL (ref 4.0–10.5)
nRBC: 1.2 % — ABNORMAL HIGH (ref 0.0–0.2)

## 2024-03-31 LAB — TROPONIN I (HIGH SENSITIVITY)
Troponin I (High Sensitivity): 8 ng/L (ref <18)
Troponin I (High Sensitivity): 9 ng/L (ref <18)

## 2024-03-31 LAB — RESP PANEL BY RT-PCR (RSV, FLU A&B, COVID)  RVPGX2
Influenza A by PCR: NEGATIVE
Influenza B by PCR: NEGATIVE
Resp Syncytial Virus by PCR: NEGATIVE
SARS Coronavirus 2 by RT PCR: NEGATIVE

## 2024-03-31 LAB — BRAIN NATRIURETIC PEPTIDE: B Natriuretic Peptide: 34.3 pg/mL (ref 0.0–100.0)

## 2024-03-31 LAB — ETHANOL: Alcohol, Ethyl (B): 31 mg/dL — ABNORMAL HIGH (ref <15)

## 2024-03-31 MED ORDER — THIAMINE MONONITRATE 100 MG PO TABS: 100.0000 mg | ORAL_TABLET | Freq: Every day | ORAL | Status: DC

## 2024-03-31 MED ORDER — SODIUM CHLORIDE 0.9 % IV BOLUS
500.0000 mL | Freq: Once | INTRAVENOUS | Status: AC
  Administered 2024-03-31: 500 mL via INTRAVENOUS

## 2024-03-31 MED ORDER — ACETAMINOPHEN 500 MG PO TABS
1000.0000 mg | ORAL_TABLET | ORAL | Status: DC
  Filled 2024-03-31: qty 2

## 2024-03-31 MED ORDER — LORAZEPAM 1 MG PO TABS: 1.0000 mg | ORAL_TABLET | ORAL | Status: DC | PRN

## 2024-03-31 MED ORDER — IOHEXOL 350 MG/ML SOLN
75.0000 mL | Freq: Once | INTRAVENOUS | Status: AC | PRN
  Administered 2024-03-31: 75 mL via INTRAVENOUS

## 2024-03-31 MED ORDER — LACTATED RINGERS IV BOLUS
1000.0000 mL | Freq: Once | INTRAVENOUS | Status: AC
  Administered 2024-03-31: 1000 mL via INTRAVENOUS

## 2024-03-31 MED ORDER — FOLIC ACID 1 MG PO TABS
1.0000 mg | ORAL_TABLET | Freq: Every day | ORAL | Status: DC
Start: 2024-03-31 — End: 2024-03-31
  Administered 2024-03-31: 1 mg via ORAL
  Filled 2024-03-31: qty 1

## 2024-03-31 MED ORDER — ADULT MULTIVITAMIN W/MINERALS CH
1.0000 | ORAL_TABLET | Freq: Every day | ORAL | Status: DC
  Administered 2024-03-31: 1 via ORAL
  Filled 2024-03-31: qty 1

## 2024-03-31 MED ORDER — THIAMINE HCL 100 MG/ML IJ SOLN
100.0000 mg | Freq: Every day | INTRAMUSCULAR | Status: DC
  Administered 2024-03-31: 100 mg via INTRAVENOUS
  Filled 2024-03-31: qty 2

## 2024-03-31 MED ORDER — POTASSIUM CHLORIDE CRYS ER 20 MEQ PO TBCR
40.0000 meq | EXTENDED_RELEASE_TABLET | Freq: Once | ORAL | Status: AC
  Administered 2024-03-31: 40 meq via ORAL
  Filled 2024-03-31: qty 2

## 2024-03-31 MED ORDER — CHLORDIAZEPOXIDE HCL 25 MG PO CAPS: ORAL_CAPSULE | ORAL | 0 refills | Status: DC

## 2024-03-31 MED ORDER — CHLORDIAZEPOXIDE HCL 25 MG PO CAPS
50.0000 mg | ORAL_CAPSULE | Freq: Once | ORAL | Status: AC
  Administered 2024-03-31: 50 mg via ORAL
  Filled 2024-03-31: qty 2

## 2024-03-31 NOTE — ED Triage Notes (Signed)
 PT BIB [facility] from home after experiencing weakness and SOB x2 days. PT states they had in home care arrive yesterday and was told to come to the hospital. [facility] vitals BP 118/78, P 120, RR 18, Spo2 100 on  RA. Aox4 Hx of arthritis, no other complaints.

## 2024-03-31 NOTE — ED Notes (Signed)
 Patient transported to vascular.

## 2024-03-31 NOTE — Progress Notes (Signed)
 VASCULAR LAB    Left lower extremity venous duplex has been performed.  See CV proc for preliminary results.  Relayed negative results to Dr. Efraim Grange via secure chat   Carleene Chase, RVT 03/31/2024, 9:45 AM

## 2024-03-31 NOTE — Discharge Instructions (Addendum)
 You were seen for your elevated heart rate in the emergency department.  It is likely due to dehydration and alcohol withdrawal.  At home, please take the Librium for your alcohol withdrawal to make sure it does not get worse.  Do not take this before driving or operating heavy machinery because it can make you drowsy.  Do not take with alcohol.  Take Tylenol and ibuprofen  for your foot pain.  Check your MyChart online for the results of any tests that had not resulted by the time you left the emergency department.   Follow-up with your primary doctor in 2-3 days regarding your visit.  Follow-up with sports medicine about your left ankle pain.  If you do not have a primary care doctor you may follow-up with [facility] which is listed in this packet.   Return immediately to the emergency department if you experience any of the following: Worsening pain, fevers, seizures, or any other concerning symptoms.    Thank you for visiting our Emergency Department. It was a pleasure taking care of you today.

## 2024-03-31 NOTE — ED Notes (Signed)
 Pt offered tylenol and refused. She adv. that she didn't need it right now and responded "I got that at home"

## 2024-03-31 NOTE — ED Provider Notes (Signed)
 Leah Woodward Provider Note   CSN: [id number] Arrival date & time: 03/31/24  4098     History {Add pertinent medical, surgical, social history, OB history to HPI:1} Chief Complaint  Patient presents with   Weakness    Leah Woodward is a 68 y.o. female.  68 year old female with a history of hypertension and alcohol abuse who presents to the emergency department with foot pain, swelling, tachycardia.  Patient denies any symptoms to me and tells me that she was referred in by her home health team.  Based on their notes it appears that she has been having left foot pain and swelling for the past 4 to 5 days.  Was tachycardic yesterday at their visit to 140 bpm.  PTAR reported that she also was experiencing generalized weakness and shortness of breath for 2 days.  Denies any chest pain.  Smokes cigarettes daily.  Has not had alcohol in approximately 2 weeks.  Denies any other drug use.       Home Medications Prior to Admission medications   Medication Sig Start Date End Date Taking? Authorizing Provider  folic acid  (FOLVITE ) 1 MG tablet TAKE 1 TABLET(1 MG) BY MOUTH DAILY 03/01/24   Elois Hair, MD  naltrexone  (DEPADE) 50 MG tablet Take 1 tablet (50 mg total) by mouth daily. 08/24/22   Elois Hair, MD      Allergies    Patient has no known allergies.    Review of Systems   Review of Systems  Physical Exam Updated Vital Signs BP (!) 144/95 (BP Location: Left Arm)   Temp 98.1 F (36.7 C) (Oral)   Resp 15   Ht 5\' 5"  (1.651 m)   Wt 47.6 kg   SpO2 100%   BMI 17.47 kg/m  Physical Exam Vitals and nursing note reviewed.  Constitutional:      General: She is not in acute distress.    Appearance: She is well-developed.  HENT:     Head: Normocephalic and atraumatic.     Right Ear: External ear normal.     Left Ear: External ear normal.     Nose: Nose normal.  Eyes:     Extraocular Movements: Extraocular  movements intact.     Conjunctiva/sclera: Conjunctivae normal.     Pupils: Pupils are equal, round, and reactive to light.  Cardiovascular:     Rate and Rhythm: Regular rhythm. Tachycardia present.     Heart sounds: Murmur heard.  Pulmonary:     Effort: Pulmonary effort is normal. No respiratory distress.     Breath sounds: Normal breath sounds.  Musculoskeletal:     Cervical back: Normal range of motion and neck supple.     Right lower leg: No edema.     Left lower leg: Edema present.     Comments: DP pulses 2+ bilaterally  Skin:    General: Skin is warm and dry.  Neurological:     Mental Status: She is alert and oriented to person, place, and time. Mental status is at baseline.  Psychiatric:        Mood and Affect: Mood normal.     ED Results / Procedures / Treatments   Labs (all labs ordered are listed, but only abnormal results are displayed) Labs Reviewed - No data to display  EKG None  Radiology No results found.  Procedures Procedures  {Document cardiac monitor, telemetry assessment procedure when appropriate:1}  Medications Ordered in ED Medications - No data  to display  ED Course/ Medical Decision Making/ A&P Clinical Course as of 03/31/24 1703  Sat Mar 31, 2024  1013 Alcohol, Ethyl (B)(!): 31 Patient reassessed.  Still says that her last drink was 2 weeks ago.  Says that she typically drinks 2 shots per day.  Has mild tongue fasciculations but no tremor.  Denies history of alcohol withdrawal. Tachycardia has improved after fluids.  Starting her on CIWA protocol. [RP]    Clinical Course User Index [RP] Ninetta Basket, MD   {   Click here for ABCD2, HEART and other calculatorsREFRESH Note before signing :1}                              Medical Decision Making Amount and/or Complexity of Data Reviewed Labs: ordered. Decision-making details documented in ED Course. Radiology: ordered.  Risk OTC drugs. Prescription drug management.   DAURICE SHADDIX is a 68 y.o. female with comorbidities that complicate the patient evaluation including ***   Initial Ddx:  ***   MDM/Course:  *** Upon re-evaluation ***  This patient presents to the ED for concern of complaints listed in HPI, this involves an extensive number of treatment options, and is a complaint that carries with it a high risk of complications and morbidity. Disposition including potential need for admission considered.   Dispo: {Disposition:28069}  Additional history obtained from {Additional History:28067} Records reviewed {Records Reviewed:28068} The following labs were independently interpreted: {labs interpreted:28064} and show {lab findings:28250} I independently reviewed the following imaging with scope of interpretation limited to determining acute life threatening conditions related to emergency care: {imaging interpreted:28065} and agree with the radiologist interpretation with the following exceptions: none I personally reviewed and interpreted cardiac monitoring: {cardiac monitoring:28251} I personally reviewed and interpreted the pt's EKG: see above for interpretation  I have reviewed the patients home medications and made adjustments as needed Consults: {Consultants:28063} Social Determinants of health:  ***  Portions of this note were generated with Scientist, clinical (histocompatibility and immunogenetics). Dictation errors may occur despite best attempts at proofreading.    {Document your decision making why or why not admission, treatments were needed:1} Final Clinical Impression(s) / ED Diagnoses Final diagnoses:  None    Rx / DC Orders ED Discharge Orders     None

## 2024-03-31 NOTE — ED Notes (Signed)
CCMD notified

## 2024-04-06 ENCOUNTER — Telehealth: Payer: Self-pay

## 2024-04-06 ENCOUNTER — Emergency Department (HOSPITAL_COMMUNITY)

## 2024-04-06 ENCOUNTER — Inpatient Hospital Stay (HOSPITAL_COMMUNITY)
Admission: EM | Admit: 2024-04-06 | Discharge: 2024-05-06 | DRG: 896 | Disposition: E | Attending: Family Medicine | Admitting: Family Medicine

## 2024-04-06 DIAGNOSIS — R404 Transient alteration of awareness: Secondary | ICD-10-CM | POA: Diagnosis not present

## 2024-04-06 DIAGNOSIS — W19XXXA Unspecified fall, initial encounter: Secondary | ICD-10-CM | POA: Diagnosis present

## 2024-04-06 DIAGNOSIS — M5030 Other cervical disc degeneration, unspecified cervical region: Secondary | ICD-10-CM | POA: Diagnosis present

## 2024-04-06 DIAGNOSIS — R4182 Altered mental status, unspecified: Principal | ICD-10-CM

## 2024-04-06 DIAGNOSIS — I2510 Atherosclerotic heart disease of native coronary artery without angina pectoris: Secondary | ICD-10-CM | POA: Diagnosis present

## 2024-04-06 DIAGNOSIS — E876 Hypokalemia: Secondary | ICD-10-CM | POA: Diagnosis present

## 2024-04-06 DIAGNOSIS — F10139 Alcohol abuse with withdrawal, unspecified: Secondary | ICD-10-CM | POA: Diagnosis present

## 2024-04-06 DIAGNOSIS — L89150 Pressure ulcer of sacral region, unstageable: Secondary | ICD-10-CM | POA: Diagnosis present

## 2024-04-06 DIAGNOSIS — L89222 Pressure ulcer of left hip, stage 2: Secondary | ICD-10-CM | POA: Diagnosis present

## 2024-04-06 DIAGNOSIS — K709 Alcoholic liver disease, unspecified: Secondary | ICD-10-CM | POA: Diagnosis present

## 2024-04-06 DIAGNOSIS — J9601 Acute respiratory failure with hypoxia: Secondary | ICD-10-CM | POA: Diagnosis not present

## 2024-04-06 DIAGNOSIS — L8990 Pressure ulcer of unspecified site, unspecified stage: Secondary | ICD-10-CM | POA: Insufficient documentation

## 2024-04-06 DIAGNOSIS — R5381 Other malaise: Secondary | ICD-10-CM | POA: Diagnosis present

## 2024-04-06 DIAGNOSIS — M79605 Pain in left leg: Secondary | ICD-10-CM | POA: Diagnosis present

## 2024-04-06 DIAGNOSIS — R509 Fever, unspecified: Secondary | ICD-10-CM | POA: Diagnosis not present

## 2024-04-06 DIAGNOSIS — Z681 Body mass index (BMI) 19 or less, adult: Secondary | ICD-10-CM

## 2024-04-06 DIAGNOSIS — F1090 Alcohol use, unspecified, uncomplicated: Secondary | ICD-10-CM | POA: Diagnosis not present

## 2024-04-06 DIAGNOSIS — F17210 Nicotine dependence, cigarettes, uncomplicated: Secondary | ICD-10-CM | POA: Diagnosis present

## 2024-04-06 DIAGNOSIS — E440 Moderate protein-calorie malnutrition: Secondary | ICD-10-CM | POA: Diagnosis present

## 2024-04-06 DIAGNOSIS — I10 Essential (primary) hypertension: Secondary | ICD-10-CM | POA: Diagnosis present

## 2024-04-06 DIAGNOSIS — R053 Chronic cough: Secondary | ICD-10-CM | POA: Diagnosis present

## 2024-04-06 DIAGNOSIS — D649 Anemia, unspecified: Secondary | ICD-10-CM | POA: Diagnosis present

## 2024-04-06 DIAGNOSIS — Z515 Encounter for palliative care: Secondary | ICD-10-CM | POA: Diagnosis not present

## 2024-04-06 DIAGNOSIS — R400 Somnolence: Secondary | ICD-10-CM | POA: Diagnosis not present

## 2024-04-06 DIAGNOSIS — Z66 Do not resuscitate: Secondary | ICD-10-CM | POA: Diagnosis not present

## 2024-04-06 DIAGNOSIS — R638 Other symptoms and signs concerning food and fluid intake: Secondary | ICD-10-CM | POA: Insufficient documentation

## 2024-04-06 DIAGNOSIS — Z602 Problems related to living alone: Secondary | ICD-10-CM | POA: Diagnosis present

## 2024-04-06 DIAGNOSIS — R000 Tachycardia, unspecified: Secondary | ICD-10-CM | POA: Diagnosis present

## 2024-04-06 DIAGNOSIS — F32A Depression, unspecified: Secondary | ICD-10-CM | POA: Diagnosis present

## 2024-04-06 DIAGNOSIS — Z7189 Other specified counseling: Secondary | ICD-10-CM | POA: Diagnosis not present

## 2024-04-06 DIAGNOSIS — E873 Alkalosis: Secondary | ICD-10-CM | POA: Diagnosis present

## 2024-04-06 DIAGNOSIS — Z789 Other specified health status: Secondary | ICD-10-CM

## 2024-04-06 LAB — I-STAT VENOUS BLOOD GAS, ED
Acid-Base Excess: 1 mmol/L (ref 0.0–2.0)
Bicarbonate: 24.7 mmol/L (ref 20.0–28.0)
Calcium, Ion: 1.11 mmol/L — ABNORMAL LOW (ref 1.15–1.40)
HCT: 29 % — ABNORMAL LOW (ref 36.0–46.0)
Hemoglobin: 9.9 g/dL — ABNORMAL LOW (ref 12.0–15.0)
O2 Saturation: 97 %
Potassium: 3.4 mmol/L — ABNORMAL LOW (ref 3.5–5.1)
Sodium: 137 mmol/L (ref 135–145)
TCO2: 26 mmol/L (ref 22–32)
pCO2, Ven: 33.1 mmHg — ABNORMAL LOW (ref 44–60)
pH, Ven: 7.48 — ABNORMAL HIGH (ref 7.25–7.43)
pO2, Ven: 79 mmHg — ABNORMAL HIGH (ref 32–45)

## 2024-04-06 LAB — COMPREHENSIVE METABOLIC PANEL WITH GFR
ALT: 33 U/L (ref 0–44)
AST: 66 U/L — ABNORMAL HIGH (ref 15–41)
Albumin: 3 g/dL — ABNORMAL LOW (ref 3.5–5.0)
Alkaline Phosphatase: 67 U/L (ref 38–126)
Anion gap: 16 — ABNORMAL HIGH (ref 5–15)
BUN: 8 mg/dL (ref 8–23)
CO2: 21 mmol/L — ABNORMAL LOW (ref 22–32)
Calcium: 9.8 mg/dL (ref 8.9–10.3)
Chloride: 100 mmol/L (ref 98–111)
Creatinine, Ser: 0.52 mg/dL (ref 0.44–1.00)
GFR, Estimated: >60 mL/min (ref >60)
Glucose, Bld: 91 mg/dL (ref 70–99)
Potassium: 3.5 mmol/L (ref 3.5–5.1)
Sodium: 137 mmol/L (ref 135–145)
Total Bilirubin: 1.1 mg/dL (ref 0.0–1.2)
Total Protein: 7 g/dL (ref 6.5–8.1)

## 2024-04-06 LAB — CBC WITH DIFFERENTIAL/PLATELET
Abs Immature Granulocytes: 0.08 10*3/uL — ABNORMAL HIGH (ref 0.00–0.07)
Basophils Absolute: 0 10*3/uL (ref 0.0–0.1)
Basophils Relative: 0 %
Eosinophils Absolute: 0 10*3/uL (ref 0.0–0.5)
Eosinophils Relative: 0 %
HCT: 29.5 % — ABNORMAL LOW (ref 36.0–46.0)
Hemoglobin: 9.3 g/dL — ABNORMAL LOW (ref 12.0–15.0)
Immature Granulocytes: 1 %
Lymphocytes Relative: 8 %
Lymphs Abs: 0.9 10*3/uL (ref 0.7–4.0)
MCH: 30 pg (ref 26.0–34.0)
MCHC: 31.5 g/dL (ref 30.0–36.0)
MCV: 95.2 fL (ref 80.0–100.0)
Monocytes Absolute: 0.8 10*3/uL (ref 0.1–1.0)
Monocytes Relative: 7 %
Neutro Abs: 9.5 10*3/uL — ABNORMAL HIGH (ref 1.7–7.7)
Neutrophils Relative %: 84 %
Platelets: 269 10*3/uL (ref 150–400)
RBC: 3.1 MIL/uL — ABNORMAL LOW (ref 3.87–5.11)
RDW: 14.4 % (ref 11.5–15.5)
WBC: 11.3 10*3/uL — ABNORMAL HIGH (ref 4.0–10.5)
nRBC: 0 % (ref 0.0–0.2)

## 2024-04-06 LAB — I-STAT CG4 LACTIC ACID, ED: Lactic Acid, Venous: 1.2 mmol/L (ref 0.5–1.9)

## 2024-04-06 LAB — I-STAT CHEM 8, ED
BUN: 7 mg/dL — ABNORMAL LOW (ref 8–23)
Calcium, Ion: 1.13 mmol/L — ABNORMAL LOW (ref 1.15–1.40)
Chloride: 101 mmol/L (ref 98–111)
Creatinine, Ser: 0.4 mg/dL — ABNORMAL LOW (ref 0.44–1.00)
Glucose, Bld: 98 mg/dL (ref 70–99)
HCT: 31 % — ABNORMAL LOW (ref 36.0–46.0)
Hemoglobin: 10.5 g/dL — ABNORMAL LOW (ref 12.0–15.0)
Potassium: 3.3 mmol/L — ABNORMAL LOW (ref 3.5–5.1)
Sodium: 137 mmol/L (ref 135–145)
TCO2: 24 mmol/L (ref 22–32)

## 2024-04-06 LAB — RAPID URINE DRUG SCREEN, HOSP PERFORMED
Amphetamines: NOT DETECTED
Barbiturates: NOT DETECTED
Benzodiazepines: POSITIVE — AB
Cocaine: NOT DETECTED
Opiates: NOT DETECTED
Tetrahydrocannabinol: POSITIVE — AB

## 2024-04-06 LAB — URINALYSIS, ROUTINE W REFLEX MICROSCOPIC
Bilirubin Urine: NEGATIVE
Glucose, UA: NEGATIVE mg/dL
Hgb urine dipstick: NEGATIVE
Ketones, ur: 20 mg/dL — AB
Leukocytes,Ua: NEGATIVE
Nitrite: NEGATIVE
Protein, ur: NEGATIVE mg/dL
Specific Gravity, Urine: 1.02 (ref 1.005–1.030)
pH: 5 (ref 5.0–8.0)

## 2024-04-06 LAB — ETHANOL: Alcohol, Ethyl (B): <15 mg/dL (ref <15)

## 2024-04-06 LAB — SALICYLATE LEVEL: Salicylate Lvl: <7 mg/dL — ABNORMAL LOW (ref 7.0–30.0)

## 2024-04-06 LAB — AMMONIA: Ammonia: 21 umol/L (ref 9–35)

## 2024-04-06 LAB — LITHIUM LEVEL: Lithium Lvl: <0.06 mmol/L — ABNORMAL LOW (ref 0.60–1.20)

## 2024-04-06 LAB — ACETAMINOPHEN LEVEL: Acetaminophen (Tylenol), Serum: <10 ug/mL — ABNORMAL LOW (ref 10–30)

## 2024-04-06 LAB — CBG MONITORING, ED: Glucose-Capillary: 94 mg/dL (ref 70–99)

## 2024-04-06 LAB — CK: Total CK: 314 U/L — ABNORMAL HIGH (ref 38–234)

## 2024-04-06 MED ORDER — ENOXAPARIN SODIUM 40 MG/0.4ML IJ SOSY
40.0000 mg | PREFILLED_SYRINGE | INTRAMUSCULAR | Status: DC
  Administered 2024-04-07: 40 mg via SUBCUTANEOUS
  Filled 2024-04-06: qty 0.4

## 2024-04-06 MED ORDER — LORAZEPAM 2 MG/ML IJ SOLN: 1.0000 mg | Freq: Once | INTRAMUSCULAR | Status: DC

## 2024-04-06 MED ORDER — LACTATED RINGERS IV BOLUS
500.0000 mL | Freq: Once | INTRAVENOUS | Status: AC
  Administered 2024-04-06: 500 mL via INTRAVENOUS

## 2024-04-06 NOTE — ED Provider Notes (Signed)
 [place] EMERGENCY DEPARTMENT AT [facility] Provider Note   CSN: [id number] Arrival date & time: 04/06/24  1355     History {Add pertinent medical, surgical, social history, OB history to HPI:1} Chief Complaint  Patient presents with   Altered Mental Status   Fall    Leah Woodward is a 68 y.o. female BIB EMS for AMS. HX gathered at bedside by EMS report, Review of EMR including recent ED visit,*** and outside primary care and podiatry notes. EMS reports that the patient was found by family on the ground wedged between her wheelchair and couch. Patient was confused,covered in urine, did not have pants or underwear. Family reports that she is normally alert and oriented and that she was in her normal state of health and was able to ambulate to the door to answer the bell from one of her family members. The patient reports recent falls to EMS and EMS states that she has obvious signs of head trauma. Review of EMR shows patient was seen by a primary care provider via what appears to be a telehealth conference on 03/30/2024.  She was seen for foot pain was notably tachycardic to 140 the entire visit and was asked to go to the emergency department at that time.  Patient declined states that she would have a family member take her the next day.  According to the note the patient attributed her tachycardia to having stopped drinking 2 weeks ago and during the telehealth visit a friend came to the door to bring her a bottle of rum and cigarettes. Patient was seen the next day for evaluation had thorough workup in the emergency department including evaluation with CT angiogram DVT study and heart rate normalized with fluid resuscitation.  Patient unable to provide any history at this time.  EMR also shows that the patient is on Librium  and naltrexone .   Altered Mental Status Fall       Home Medications Prior to Admission medications   Medication Sig Start Date End Date  Taking? Authorizing Provider  chlordiazePOXIDE  (LIBRIUM ) 25 MG capsule 50mg  PO TID x 1D, then 25-50mg  PO BID X 1D, then 25-50mg  PO QD X 1D 03/31/24   Ninetta Basket, MD  folic acid  (FOLVITE ) 1 MG tablet TAKE 1 TABLET(1 MG) BY MOUTH DAILY 03/01/24   Elois Hair, MD  naltrexone  (DEPADE) 50 MG tablet Take 1 tablet (50 mg total) by mouth daily. 08/24/22   Elois Hair, MD      Allergies    Patient has no known allergies.    Review of Systems   Review of Systems  Physical Exam Updated Vital Signs SpO2 95%  Physical Exam Vitals and nursing note reviewed. Exam conducted with a chaperone present.  Constitutional:      Appearance: She is not ill-appearing or toxic-appearing.  HENT:     Head: Normocephalic.     Comments: 2 hematomas noted to forehead    Right Ear: Tympanic membrane normal.     Left Ear: Tympanic membrane normal.     Nose: Nose normal.     Mouth/Throat:     Mouth: Mucous membranes are moist.  Eyes:     Extraocular Movements: Extraocular movements intact.     Pupils: Pupils are equal, round, and reactive to light.  Neck:     Comments: C-collar in place Cardiovascular:     Rate and Rhythm: Tachycardia present.     Pulses: Normal pulses.  Pulmonary:  Effort: Pulmonary effort is normal.     Breath sounds: Normal breath sounds.  Abdominal:     General: Abdomen is flat. There is no distension.     Tenderness: There is no abdominal tenderness.  Musculoskeletal:        General: No deformity. Normal range of motion.     Right lower leg: No edema.  Skin:    General: Skin is warm.  Neurological:     Mental Status: She is alert. She is disoriented and confused.     GCS: GCS eye subscore is 4. GCS verbal subscore is 4. GCS motor subscore is 6.     Cranial Nerves: No dysarthria.     Motor: No weakness.     ED Results / Procedures / Treatments   Labs (all labs ordered are listed, but only abnormal results are displayed) Labs Reviewed  COMPREHENSIVE  METABOLIC PANEL WITH GFR  CBC WITH DIFFERENTIAL/PLATELET  URINALYSIS, ROUTINE W REFLEX MICROSCOPIC  AMMONIA  LITHIUM LEVEL  VITAMIN B1  ETHANOL  RAPID URINE DRUG SCREEN, HOSP PERFORMED  ACETAMINOPHEN  LEVEL  SALICYLATE LEVEL  CK  CBG MONITORING, ED  I-STAT CG4 LACTIC ACID, ED  I-STAT CHEM 8, ED  I-STAT VENOUS BLOOD GAS, ED    EKG None  Radiology No results found.  Procedures Procedures  {Document cardiac monitor, telemetry assessment procedure when appropriate:1}  Medications Ordered in ED Medications - No data to display  ED Course/ Medical Decision Making/ A&P   {   Click here for ABCD2, HEART and other calculatorsREFRESH Note before signing :1}                              Medical Decision Making Amount and/or Complexity of Data Reviewed Labs: ordered. Radiology: ordered.   This patient presents to the ED for concern of AMS, this involves an extensive number of treatment options, and is a complaint that carries with it a high risk of complications and morbidity.   The differential diagnosis for AMS is extensive and includes, but is not limited to: drug overdose - opioids, alcohol, sedatives, antipsychotics, drug withdrawal, others; Metabolic: hypoxia, hypoglycemia, hyperglycemia, hypercalcemia, hypernatremia, hyponatremia, uremia, hepatic encephalopathy, hypothyroidism, hyperthyroidism, vitamin B12 or thiamine  deficiency, carbon monoxide poisoning, Wilson's disease, Lactic acidosis, DKA/HHOS; Infectious: meningitis, encephalitis, bacteremia/sepsis, urinary tract infection, pneumonia, neurosyphilis; Structural: Space-occupying lesion, (brain tumor, subdural hematoma, hydrocephalus,); Vascular: stroke, subarachnoid hemorrhage, coronary ischemia, hypertensive encephalopathy, CNS vasculitis, thrombotic thrombocytopenic purpura, disseminated intravascular coagulation, hyperviscosity; Psychiatric: Schizophrenia, depression; Other: Seizure, hypothermia, heat stroke, ICU  psychosis, dementia -"sundowning." I have also considered lithium toxicity, beriberi.   Co morbidities:    has a past medical history of Depression, Hemochromatosis, and Hypertension. Chronic foot pain, chronic alcohol abuse, tobacco abuse   Social Determinants of Health:   SDOH Screenings   Depression (PHQ2-9): Medium Risk (03/22/2022)  Tobacco Use: High Risk (03/31/2024)  Alcohol abuse: High risk   Additional history:  {Additional history obtained from ***} {External records from outside source obtained and reviewed including}  Lab Tests:  I Ordered, and personally interpreted labs.  The pertinent results include:  ***  Imaging Studies:  I ordered imaging studies including *** I independently visualized and interpreted imaging which showed *** I agree with the radiologist interpretation  Cardiac Monitoring/ECG:  The patient was maintained on a cardiac monitor.  I personally viewed and interpreted the cardiac monitored which showed an underlying rhythm of: ***  Medicines ordered and prescription drug management:  I ordered medication including Medications - No data to display for *** Reevaluation of the patient after these medicines showed that the patient {resolved/improved/worsened:23923::"improved"} I have reviewed the patients home medicines and have made adjustments as needed  Test Considered:  ***  Critical Interventions:  ***  Consultations Obtained: ***  Problem List / ED Course:  No diagnosis found.  MDM: ***   Dispostion:  After consideration of the diagnostic results and the patients response to treatment, I feel that the patent would benefit from ***.   {Document critical care time when appropriate:1} {Document review of labs and clinical decision tools ie heart score, Chads2Vasc2 etc:1}  {Document your independent review of radiology images, and any outside records:1} {Document your discussion with family members, caretakers, and  with consultants:1} {Document social determinants of health affecting pt's care:1} {Document your decision making why or why not admission, treatments were needed:1} Final Clinical Impression(s) / ED Diagnoses Final diagnoses:  None    Rx / DC Orders ED Discharge Orders     None

## 2024-04-06 NOTE — H&P (Cosign Needed Addendum)
 Hospital Admission History and Physical Service Pager: [phone]  Patient name: Leah Woodward Medical record number: [id number] Date of Birth: January 11, 1956 Age: 68 y.o. Gender: female  Primary Care Provider: Ernestina Headland, MD Consultants: N/a Code Status: Full Code  - confirmed by patient  Preferred Emergency Contact: Briana Campbell Information     Name Relation Home Work Mobile   [place] Sister   [phone]      Other Contacts     Name Relation Home Work Mobile   kellogg,Gary Brother [phone]  [phone]       Chief Complaint: AMS  Assessment and Plan: Leah Woodward is a 68 y.o. female presenting with AMS. Differential for this patient's presentation of this includes:  Sedating meds vs Withdrawl: Recently prescribed chlordiazepoxide  with unclear adherence history. UDS positive for benzodiazepines and THC.  Alcohol: Negative alcohol level on arrival. Hx of AUD.  Arrhythmia/Cardiac: EKG with sinus tachycardia, no ischemic findings.  Electrolytes: No significant electrolyte disturbance on arrival.  Endocrine: Normal CBG on arrival. Will collect TSH.  Infection: Patient afebrile without leukocytosis. Has chronic cough.  Hypoxia/Hypercarbia: Satting well on RA. Unremarkable bicarb on arrival.  Trauma: CT Head and C spine without acute findings.  Stroke: Unremarkable CT Head.  Seizure: No seizure like activity observed.  Assessment & Plan Altered mental status CT head and c-spine without acute findings. UDS positive for benzos, THC. No alcohol, salicylates. No electrolyte abnormalities. EKG with sinus tachycardia, no ischemic findings. Overall etiology concerning for sedation secondary to medications, given recent prescription for Librium . Patient has hx of EtOH abuse and took half her librium  taper prescribed by previous ED visit. Suspect patient may have taken improper dose of librium  or suddenly stopped. Per family, patient's mental status  appears to wax/wane at this time. Patient demonstrating signs/symptoms consistent with withdrawal, including elevated BP, tachycardia, pinpoint pupils, clonus. Per nursing, patient had some trouble with bedside swallow.  - Admit to FMTS with attending Dr Andree Kayser  - TSH, RPR, B12 ordered  - CCM - Vitals per floor - Delirium precautions  - NPO pending SLP eval  - PT/OT eval  - AM CBC, BMP Tachycardia Intermittent tachycardia to 110s. Suspect secondary to mild dehydration vs possible withdrawal. EKG with sinus tachycardia, no ischemic findings.  - LR bolus ordered  - CCM  Alcohol use disorder Some reported history of withdrawal, no known hospitalizations.  - CIWAs - consider prn Ativan  as needed - Thiamine  level in process Chronic health problem HTN: Not on any BP meds at home. Elevated BP on arrival, CTM.   FEN/GI: NPO pending SLP eval  VTE Prophylaxis: Lovenox   Disposition: Med-tele   History of Present Illness:  Leah Woodward is a 68 y.o. female presenting with AMS  Sister in law visited yesterday, because patient seemed funny and noticed that she was shaking. Sister went with her other sister this morning, to check her. Got into the house, and she was lying on/in between the walker. They are unsure when she fell, but found her on the floor confused. She was able to wake up, but was confused/not recognizing them. They noted she had urinated on the floor, was not shaking, but had a slight tremor in her hands and chin.   Cough Has had a cough that comes and goes, her sisters note that it's similar to how it usually is and productive. No recent fevers, but often has nausea and lack of appetite. Has been vomiting and having bowel movements a  lot over the last couple of weeks.   In the ED, patient arrived tachycardic with elevated BP. CT Head and C spine without acute findings. UDS positive for benzos, THC. EKG with sinus tach, no ischemic findings. Unremarkable alcohol,  ammonia, tylenol , salicylate levels. Normal CBG, no electrolyte disturbances.   Review Of Systems: Per HPI   Pertinent Past Medical History: HTN Hemochromatosis  Depression AUD Remainder reviewed in history tab.   Pertinent Past Surgical History: Appendectomy   Remainder reviewed in history tab.   Pertinent Social History: Tobacco use: Yes, 4 cigarettes a day, smoking for 40 years got up to a pack a day Alcohol use: Heavy use for 5-10 years per family, unknown last drink  Other Substance use: THC use Lives alone  Pertinent Family History: DM2 in sisters and mother No hx of dementia, CVA, mood disorders or substance abuse Remainder reviewed in history tab.   Important Outpatient Medications: Per family patient only taking:  -Folic acid  daily  -Chlordiazepoxide  (taper to end 4/26, only 5/10 pills taken) Remainder reviewed in medication history.   Objective: BP (!) 152/96   Pulse 100   Temp 98.1 F (36.7 C) (Oral)   Resp (!) 21   SpO2 100%  Exam: General: Sleepy. Awoken by voice and light touch. No acute distress.  CV: Normal S1/S2. No extra heart sounds. Warm and well-perfused. Pulm: Breathing comfortably on room air. CTAB anteriorly. No increased WOB. Abd: Normoactive BS. Soft, non-tender, mildly distended.  Skin/Ext:  Warm, dry. No extremity edema.  Neuro:  General: Sleepy, easily awoken. Oriented to name, birthday, place. Not oriented to year. Motor: 4/5 upper and lower extremity strength. Dysmetric FTN exam.  Sensation: Upper and lower extremity sensation intact.  Reflexes: Bilateral ankle clonus present. 4+ bilateral patellar reflexes.  Cranial:  CN3,4,6: Pinpoint pupils bilaterally, minimally reactive to light.  EOMI.  CN5: Facial sensation intact bilaterally.  CN7: Facial expressions symmetric. CN8: Hearing intact bilaterally.  CN10: Somewhat slurred speech.  CN11: Able to turn head against mild resistance bilaterally.  CN12: Tongue midline. Fasciculations  present.    Labs:  CBC BMET  Recent Labs  Lab 04/06/24 1510 04/06/24 1522 04/06/24 1531  WBC 11.3*  --   --   HGB 9.3*   < > 9.9*  HCT 29.5*   < > 29.0*  PLT 269  --   --    < > = values in this interval not displayed.   Recent Labs  Lab 04/06/24 1510 04/06/24 1522 04/06/24 1531  NA 137 137 137  K 3.5 3.3* 3.4*  CL 100 101  --   CO2 21*  --   --   BUN 8 7*  --   CREATININE 0.52 0.40*  --   GLUCOSE 91 98  --   CALCIUM 9.8  --   --      UDS: + Benzodiazepines, THC  CK: 314 Ammonia: 21 Ethanol: <15 Tylenol : <10 Salicylate: <7  EKG: Sinus tachycardia. No ischemic findings.    Imaging Studies Performed:  CXR: No active disease.  CT Head, Cspine: No acute intracranial abnormality. No acute cervical spine fracture. Mild chronic small vessel ischemic disease and mild-mod cerebral atrophy. Advanced cervical disc degeneration.    Carey Chapman, MD 7:25 PM 04/06/24 PGY-1, [facility]  FPTS Intern pager: [phone], text pages welcome Secure chat group [facility] Teaching Service   Upper Level Addendum: I have seen and evaluated this patient along with Dr. Fernand Howard and reviewed the above  note, making necessary revisions as appropriate. I agree with the medical decision making and physical exam as noted above. Wilhemena Harbour, MD PGY-3 [facility] Residency

## 2024-04-06 NOTE — Assessment & Plan Note (Signed)
 HTN: Not on any BP meds at home. Elevated BP on arrival, CTM.

## 2024-04-06 NOTE — ED Notes (Signed)
 Central monitoring called to place pt on monitoring

## 2024-04-06 NOTE — Progress Notes (Signed)
 Pt recived to room 27 from Ed. Vitals are stable. Alert and oriented to person, time. Will continue plan of care.

## 2024-04-06 NOTE — ED Notes (Addendum)
 This RN attempted bedside swallow test per Dr.Yang order to this RN. Pt swallowed a spoonful of water. Pt would let the water sit in her mouth for 10 seconds and then swallow. Pt would then have to swallow 2-3 more times before trying to clear her throat. Dr.Yang and Dr.Sowell made aware of results. They said to keep pt NPO and SLP would eval pt tomorrow.

## 2024-04-06 NOTE — Telephone Encounter (Signed)
 Attempted to reach patient to make ED follow up. No answer. Unable to LVM due to mailbox being full. Linnie Riches, CMA

## 2024-04-06 NOTE — Plan of Care (Signed)
 FMTS Interim Progress Note  S:Saw Leah Woodward. Her sisters Leah Woodward [phone]) and Leah Woodward [phone]) are at bedside. Sisters are helping with history. Leah Woodward is most knowledgeable about what happened.  Leah Leah Woodward is resting comfortably, awake and alert. Denies any complaints at this time.  O: BP (!) 152/96   Pulse 100   Temp 98.1 F (36.7 C) (Oral)   Resp (!) 21   SpO2 100%   Gen: Pleasantly altered, awake and alert. Oriented to self Leah Woodward), but not to time. No agitation.  Resp: Normal WOB on RA  A/P:  AMS Pt is stable for night team to admit. Both sisters plan to be here for next few hours, but are also reachable by phone if needed.   Albin Huh, MD 04/06/2024, 6:46 PM PGY-2, [facility] Family Medicine Service pager [phone]

## 2024-04-06 NOTE — Assessment & Plan Note (Signed)
 Some reported history of withdrawal, no known hospitalizations.  - CIWAs - consider prn Ativan  as needed - Thiamine  level in process

## 2024-04-06 NOTE — Assessment & Plan Note (Addendum)
 CT head and c-spine without acute findings. UDS positive for benzos, THC. No alcohol, salicylates. No electrolyte abnormalities. EKG with sinus tachycardia, no ischemic findings. Overall etiology concerning for sedation secondary to medications, given recent prescription for Librium . Patient has hx of EtOH abuse and took half her librium  taper prescribed by previous ED visit. Suspect patient may have taken improper dose of librium  or suddenly stopped. Per family, patient's mental status appears to wax/wane at this time. Patient demonstrating signs/symptoms consistent with withdrawal, including elevated BP, tachycardia, pinpoint pupils, clonus. Per nursing, patient had some trouble with bedside swallow.  - Admit to FMTS with attending Dr Andree Kayser  - TSH, RPR, B12 ordered  - CCM - Vitals per floor - Delirium precautions  - NPO pending SLP eval  - PT/OT eval  - AM CBC, BMP

## 2024-04-06 NOTE — ED Triage Notes (Signed)
 Pt bib [facility] from home after sisters found her down in the floor. Unknown downtime, head was found under the wheel chair and was laying in a pool of urine. EMS reports slow to respond, pin point pupils that are non reactive, and extremities cool to touch. Patient has hematoma to right side of the head, forehead and above the left eyebrow. Patient is more altered than normal.   Patient lethargic, and A&Ox3 on arrival to the ED.

## 2024-04-06 NOTE — Assessment & Plan Note (Signed)
 Intermittent tachycardia to 110s. Suspect secondary to mild dehydration vs possible withdrawal. EKG with sinus tachycardia, no ischemic findings.  - LR bolus ordered  - CCM

## 2024-04-07 ENCOUNTER — Inpatient Hospital Stay (HOSPITAL_COMMUNITY)

## 2024-04-07 ENCOUNTER — Encounter (HOSPITAL_COMMUNITY): Payer: Self-pay | Admitting: Family Medicine

## 2024-04-07 DIAGNOSIS — E876 Hypokalemia: Secondary | ICD-10-CM | POA: Diagnosis not present

## 2024-04-07 DIAGNOSIS — R404 Transient alteration of awareness: Secondary | ICD-10-CM

## 2024-04-07 DIAGNOSIS — M79605 Pain in left leg: Secondary | ICD-10-CM | POA: Insufficient documentation

## 2024-04-07 DIAGNOSIS — F1090 Alcohol use, unspecified, uncomplicated: Secondary | ICD-10-CM

## 2024-04-07 DIAGNOSIS — R000 Tachycardia, unspecified: Secondary | ICD-10-CM

## 2024-04-07 HISTORY — DX: Hypokalemia: E87.6

## 2024-04-07 LAB — BASIC METABOLIC PANEL WITH GFR
Anion gap: 14 (ref 5–15)
BUN: 7 mg/dL — ABNORMAL LOW (ref 8–23)
CO2: 24 mmol/L (ref 22–32)
Calcium: 9.3 mg/dL (ref 8.9–10.3)
Chloride: 100 mmol/L (ref 98–111)
Creatinine, Ser: 0.63 mg/dL (ref 0.44–1.00)
GFR, Estimated: >60 mL/min (ref >60)
Glucose, Bld: 79 mg/dL (ref 70–99)
Potassium: 3 mmol/L — ABNORMAL LOW (ref 3.5–5.1)
Sodium: 138 mmol/L (ref 135–145)

## 2024-04-07 LAB — CBC
HCT: 25.4 % — ABNORMAL LOW (ref 36.0–46.0)
HCT: 25.7 % — ABNORMAL LOW (ref 36.0–46.0)
Hemoglobin: 8.1 g/dL — ABNORMAL LOW (ref 12.0–15.0)
Hemoglobin: 8.1 g/dL — ABNORMAL LOW (ref 12.0–15.0)
MCH: 29.1 pg (ref 26.0–34.0)
MCH: 28.4 pg (ref 26.0–34.0)
MCHC: 31.9 g/dL (ref 30.0–36.0)
MCHC: 31.5 g/dL (ref 30.0–36.0)
MCV: 91.4 fL (ref 80.0–100.0)
MCV: 90.2 fL (ref 80.0–100.0)
Platelets: 274 10*3/uL (ref 150–400)
Platelets: 300 10*3/uL (ref 150–400)
RBC: 2.78 MIL/uL — ABNORMAL LOW (ref 3.87–5.11)
RBC: 2.85 MIL/uL — ABNORMAL LOW (ref 3.87–5.11)
RDW: 14.1 % (ref 11.5–15.5)
RDW: 14 % (ref 11.5–15.5)
WBC: 9.5 10*3/uL (ref 4.0–10.5)
WBC: 9.8 10*3/uL (ref 4.0–10.5)
nRBC: 0 % (ref 0.0–0.2)
nRBC: 0.2 % (ref 0.0–0.2)

## 2024-04-07 LAB — T4, FREE: Free T4: 0.97 ng/dL (ref 0.61–1.12)

## 2024-04-07 LAB — VITAMIN B12: Vitamin B-12: 440 pg/mL (ref 180–914)

## 2024-04-07 LAB — HIV ANTIBODY (ROUTINE TESTING W REFLEX): HIV Screen 4th Generation wRfx: NONREACTIVE

## 2024-04-07 LAB — PROTIME-INR
INR: 1.2 (ref 0.8–1.2)
Prothrombin Time: 15.6 s — ABNORMAL HIGH (ref 11.4–15.2)

## 2024-04-07 LAB — TROPONIN I (HIGH SENSITIVITY): Troponin I (High Sensitivity): 11 ng/L (ref <18)

## 2024-04-07 LAB — TSH: TSH: 6.027 u[IU]/mL — ABNORMAL HIGH (ref 0.350–4.500)

## 2024-04-07 LAB — RPR: RPR Ser Ql: NONREACTIVE

## 2024-04-07 MED ORDER — CHLORDIAZEPOXIDE HCL 25 MG PO CAPS: 25.0000 mg | ORAL_CAPSULE | Freq: Every day | ORAL | Status: DC

## 2024-04-07 MED ORDER — CHLORDIAZEPOXIDE HCL 25 MG PO CAPS
25.0000 mg | ORAL_CAPSULE | Freq: Three times a day (TID) | ORAL | Status: AC
  Administered 2024-04-08 (×3): 25 mg via ORAL
  Filled 2024-04-07 (×3): qty 1

## 2024-04-07 MED ORDER — SODIUM CHLORIDE 0.9 % IV BOLUS
500.0000 mL | Freq: Once | INTRAVENOUS | Status: AC
  Administered 2024-04-07: 500 mL via INTRAVENOUS

## 2024-04-07 MED ORDER — LOPERAMIDE HCL 2 MG PO CAPS: 2.0000 mg | ORAL_CAPSULE | ORAL | Status: DC | PRN

## 2024-04-07 MED ORDER — CHLORDIAZEPOXIDE HCL 25 MG PO CAPS
25.0000 mg | ORAL_CAPSULE | Freq: Four times a day (QID) | ORAL | Status: AC
  Administered 2024-04-07 (×2): 25 mg via ORAL
  Filled 2024-04-07 (×2): qty 1

## 2024-04-07 MED ORDER — CHLORDIAZEPOXIDE HCL 25 MG PO CAPS
25.0000 mg | ORAL_CAPSULE | ORAL | Status: DC
  Filled 2024-04-07: qty 1

## 2024-04-07 MED ORDER — IPRATROPIUM-ALBUTEROL 0.5-2.5 (3) MG/3ML IN SOLN
3.0000 mL | Freq: Four times a day (QID) | RESPIRATORY_TRACT | Status: AC
  Administered 2024-04-07 – 2024-04-08 (×3): 3 mL via RESPIRATORY_TRACT
  Filled 2024-04-07 (×3): qty 3

## 2024-04-07 MED ORDER — POTASSIUM CHLORIDE 10 MEQ/100ML IV SOLN
10.0000 meq | INTRAVENOUS | Status: AC
Start: 2024-04-07 — End: 2024-04-07
  Administered 2024-04-07 (×3): 10 meq via INTRAVENOUS
  Filled 2024-04-07 (×2): qty 100

## 2024-04-07 MED ORDER — THIAMINE HCL 100 MG/ML IJ SOLN
100.0000 mg | Freq: Every day | INTRAMUSCULAR | Status: DC
  Administered 2024-04-07 – 2024-04-08 (×2): 100 mg via INTRAVENOUS
  Filled 2024-04-07 (×2): qty 2

## 2024-04-07 MED ORDER — ONDANSETRON 4 MG PO TBDP: 4.0000 mg | ORAL_TABLET | Freq: Four times a day (QID) | ORAL | Status: AC | PRN

## 2024-04-07 MED ORDER — MEDIHONEY WOUND/BURN DRESSING EX PSTE
1.0000 | PASTE | Freq: Every day | CUTANEOUS | Status: DC
  Administered 2024-04-08 – 2024-04-11 (×4): 1 via TOPICAL
  Filled 2024-04-07: qty 44

## 2024-04-07 MED ORDER — HYDROXYZINE HCL 25 MG PO TABS: 25.0000 mg | ORAL_TABLET | Freq: Four times a day (QID) | ORAL | Status: DC | PRN

## 2024-04-07 MED ORDER — CHLORDIAZEPOXIDE HCL 25 MG PO CAPS
25.0000 mg | ORAL_CAPSULE | Freq: Four times a day (QID) | ORAL | Status: DC | PRN
  Administered 2024-04-07: 25 mg via ORAL
  Filled 2024-04-07: qty 1

## 2024-04-07 NOTE — Plan of Care (Signed)
 FMTS Brief Progress Note  S:Went bedside to evaluate CIWA on patient. Patient sitting in bed, comfortable, awake, conversant   O: BP (!) 157/80 (BP Location: Right Arm)   Pulse (!) 117   Temp 98.2 F (36.8 C) (Oral)   Resp 18   SpO2 97%   Card: RRR, NRMG Resp: CTABL, normal WOB Abd: Soft, NTTP, non-distended   A/P: AMS Went bedside to calculate CIWA for patient. Patient CIWA ~ 5 for moderate tremor in hands, and uncertainty about date. No anxiety, agitation, sweating, hallucinations, tactile disturbances or nausea.  Patient alert and oriented to self and place. Will continue to monitor CIWAs. -CIWA's  Wilhemena Harbour, MD 04/07/2024, 7:46 AM PGY-3, [facility] Night Resident  Please page [phone] with questions.

## 2024-04-07 NOTE — Progress Notes (Signed)
 Daily Progress Note Intern Pager: [phone]  Patient name: Leah Woodward Medical record number: [id number] Date of birth: 04-07-1956 Age: 68 y.o. Gender: female  Primary Care Provider: Ernestina Headland, MD Consultants: None  Code Status: Full code   Pt Overview and Major Events to Date:  5/2: Admitted   Assessment and Plan:  Leah Woodward is a 68 y.o. female presenting with altered mental status and found down in her home on 5/2. Pertinent PMH/PSH includes hypertension, depression, alcohol use disorder, alcoholic liver damage.   Patient closer to baseline this morning, she is still very slow in her mentation but answers questions appropriately.  She is alert and oriented x 3.  So far workup for AMS multifactorial with most likely etiology being alcohol withdrawal, incorrect administration of Librium  taper, and overall decline/deconditioning.  Reassuringly CT head negative for intracranial abnormality with chronic small vessel ischemic disease and cerebral atrophy.  Will need to continue with CIWA's and add as needed Ativan  as patient does have a history of seizure secondary to alcohol withdrawal.  However, CT C-spine showed advanced cervical disc degeneration which may contribute to her overall weakness and cachectic appearance. I do not believe she is eating much at home.   Assessment & Plan Altered mental status  Alcohol Use Disorder Most likely secondary to alcohol abuse/withdrawal.  Librium  taper was started 03/31/2024 unclear if patient has been taking this appropriately.  TSH elevated with normal T4, B12 normal, UDS positive for THC and benzodiazepines. Exam positive for 2-3 beats of clonus on the left foot.  - Monitor CIWAs - Follow-up RPR and B1 labs - Start thiamine  IV, can change to PO once passes swallow  - Vitals per floor - Delirium, seizure and fall precautions  - NPO pending SLP eval  - PT/OT eval  - AM CBC, BMP - Order INR Tachycardia Tachycardic  to 110s, s/p 500 cc bolus without significant improvement, suspect element of alcohol withdrawal contributing. Patient is also anemic to 8.1.  EKG showing sinus tachycardia without irregular rhythm.  - CIWA protocol  - CCM  - monitor CBC Left leg pain Difficult exam secondary to pain. Unable to reproduce pain with palpation of left knee and hip. Patient has recent x-rays of left foot from 4/26 which were negative for infection.  - Order x-rays left hip, knee, ankle and foot  - consider uric acid level  Anemia Hgb 8.1 this morning. Baseline ~10, No signs of bleeding on exam, not currently on blood thinners. Most likely secondary to alcohol use disorder.  - trend CBC daily  - monitor for signs of bleeding  - transfuse <7, no cardiac history  Hypokalemia K 3.0 this morning. No EKG changes - 40 mEq IV repletion in process  - trend BMP Chronic health problem HTN: Not on any BP meds at home. Elevated BP on arrival, CTM.    FEN/GI: NPO, did not pass bedside swallow  PPx: Lovenox  Dispo: pending clinical improvement . Barriers include ongoing medical management.   Subjective:  Alert, answering questions. Complaining of pain in left leg that radiates up to her hip. She was found down.   Objective: Temp:  [96.7 F (35.9 C)-98.3 F (36.8 C)] 98.2 F (36.8 C) (05/03 0522) Pulse Rate:  [100-117] 117 (05/03 0522) Resp:  [18-37] 18 (05/03 0522) BP: (137-173)/(75-111) 157/80 (05/03 0522) SpO2:  [94 %-100 %] 97 % (05/03 0522) Physical Exam: General: Cachectic, no acute distress Cardiovascular: Sinus tachycardia, regular rhythm, no murmur appreciated on  exam Respiratory: No increased work of breathing, intermittent cough present Abdomen: No abdominal tenderness Extremities: Severely atrophied limbs, patient is very weak but is able to follow commands, she is unable to lift her legs off the bed but can stretch them out straight.  No peripheral edema present. Neuro: CN II: PERRL CN III,  IV,VI: EOMI CN VIII: Normal hearing CN IX,X: Symmetric palate raise  CN XI: 5/5 shoulder shrug CN XII: Symmetric tongue protrusion  2+ UE and  +3 LE reflexes bilaterally  2-3 beats of clonus on the left leg  Laboratory: Most recent CBC Lab Results  Component Value Date   WBC 9.5 04/07/2024   HGB 8.1 (L) 04/07/2024   HCT 25.4 (L) 04/07/2024   MCV 91.4 04/07/2024   PLT 274 04/07/2024   Most recent BMP    Latest Ref Rng & Units 04/07/2024    1:40 AM  BMP  Glucose 70 - 99 mg/dL 79   BUN 8 - 23 mg/dL 7   Creatinine 1.61 - 0.96 mg/dL 0.45   Sodium 409 - 811 mmol/L 138   Potassium 3.5 - 5.1 mmol/L 3.0   Chloride 98 - 111 mmol/L 100   CO2 22 - 32 mmol/L 24   Calcium 8.9 - 10.3 mg/dL 9.3     Other pertinent labs elevated TSH to 6.027, normal T4  Imaging/Diagnostic Tests: No new labs or imaging  Clem Currier, DO 04/07/2024, 8:33 AM  PGY-2,  Family Medicine FPTS Intern pager: [phone], text pages welcome Secure chat group [facility] Teaching Service

## 2024-04-07 NOTE — Evaluation (Signed)
 Clinical/Bedside Swallow Evaluation Patient Details  Name: Leah Woodward MRN: [id number] Date of Birth: 11-20-1956  Today's Date: 04/07/2024 Time: SLP Start Time (ACUTE ONLY): 0935 SLP Stop Time (ACUTE ONLY): 1020 SLP Time Calculation (min) (ACUTE ONLY): 45 min  Past Medical History:  Past Medical History:  Diagnosis Date   Depression    Hemochromatosis    Hypertension    Hypokalemia 04/07/2024   Past Surgical History:  Past Surgical History:  Procedure Laterality Date   APPENDECTOMY     HPI:  Patient is a 68 y.o. female with PMH: ETOH use, tobacco use, HTN (not on any meds at home). She presented to the hospital on 04/06/24 after being dound down for an unknown time at home. UDS positive for benzos and THC. Patient was tachycardic and had AMS. CT head negative for acute intracranial abnormality but did show advanced cervical disc degeneration, CT cervical spine reported degenerative endplate changes, and spurring from C3-4 through C5-6. SLP swallow evaluation ordered secondary to RN observing patient to hold water and swallow multiple times followed by throat clearing when attempting [facility] swallow screen.    Assessment / Plan / Recommendation  Clinical Impression  Patient presents with clinical s/s of dysphagia as per this bedside swallow evaluation. She exhibited congested sounding throat clearing and cough following bites of puree and mechanical soft solid PO's as well as after sips of thin liquids, however this was inconsistently occuring. When coughing, audible secretions heard but patient did not expectorate and seemed to swallow them instead. Prolonged mastication with graham cracker led to oral residuals that cleared with subsequent swallows and swallow of liquids. SLP recommending initiate PO diet of dys 1 (puree) solids, thin liquids and will follow for toleration and ability to advance. SLP Visit Diagnosis: Dysphagia, unspecified (R13.10)    Aspiration Risk  Mild  aspiration risk    Diet Recommendation Dysphagia 1 (Puree);Thin liquid    Liquid Administration via: Cup;Straw Medication Administration: Crushed with puree Supervision: Full supervision/cueing for compensatory strategies;Staff to assist with self feeding Compensations: Small sips/bites;Slow rate Postural Changes: Seated upright at 90 degrees    Other  Recommendations Oral Care Recommendations: Oral care BID    Recommendations for follow up therapy are one component of a multi-disciplinary discharge planning process, led by the attending physician.  Recommendations may be updated based on patient status, additional functional criteria and insurance authorization.  Follow up Recommendations Other (comment) (TBD)      Assistance Recommended at Discharge    Functional Status Assessment Patient has had a recent decline in their functional status and demonstrates the ability to make significant improvements in function in a reasonable and predictable amount of time.  Frequency and Duration min 2x/week  1 week       Prognosis Prognosis for improved oropharyngeal function: Good      Swallow Study   General Date of Onset: 04/06/24 HPI: Patient is a 68 y.o. female with PMH: ETOH use, tobacco use, HTN (not on any meds at home). She presented to the hospital on 04/06/24 after being dound down for an unknown time at home. UDS positive for benzos and THC. Patient was tachycardic and had AMS. CT head negative for acute intracranial abnormality but did show advanced cervical disc degeneration, CT cervical spine reported degenerative endplate changes, and spurring from C3-4 through C5-6. SLP swallow evaluation ordered secondary to RN observing patient to hold water and swallow multiple times followed by throat clearing when attempting [facility] swallow screen. Type of Study: Bedside Swallow  Evaluation Previous Swallow Assessment: none found Diet Prior to this Study: NPO Temperature Spikes Noted:  No Respiratory Status: Room air History of Recent Intubation: No Behavior/Cognition: Alert;Cooperative;Confused Oral Cavity Assessment: Within Functional Limits Oral Care Completed by SLP: Yes Oral Cavity - Dentition: Dentures, top;Dentures, bottom Vision: Functional for self-feeding Self-Feeding Abilities: Needs set up;Needs assist;Able to feed self Patient Positioning: Upright in chair Baseline Vocal Quality: Normal Volitional Cough: Congested    Oral/Motor/Sensory Function Overall Oral Motor/Sensory Function: Within functional limits   Ice Chips     Thin Liquid Thin Liquid: Impaired Presentation: Straw;Self Fed Pharyngeal  Phase Impairments: Suspected delayed Swallow;Cough - Delayed    Nectar Thick     Honey Thick     Puree Puree: Impaired Oral Phase Impairments: Reduced lingual movement/coordination   Solid     Solid: Impaired Oral Phase Impairments: Reduced lingual movement/coordination;Impaired mastication Oral Phase Functional Implications: Prolonged oral transit;Oral residue;Impaired mastication     Jacqualine Mater, MA, CCC-SLP Speech Therapy

## 2024-04-07 NOTE — Plan of Care (Addendum)
 FMTS Brief Progress Note  S: To bedside for night rounds.  Patient resting comfortably, asleep but awakens to voice.  Denies concerns.   O: BP (!) 167/88 (BP Location: Right Arm)   Pulse (!) 129   Temp (!) 97.3 F (36.3 C) (Oral)   Resp 20   SpO2 93%    Resting comfortably, asleep, awakens to voice  Tachycardic, regular rhythm Normal WOB on room air  A/P:  AMS/alcohol use disorder Continue Librium  taper Librium  as needed Monitor CIWA's (3>1>1 today)  Follow-up labs per Dr. Kassie Pais note  Remainder per today's progress note  - Orders reviewed. Labs for AM ordered, which was adjusted as needed.  - If condition changes, plan includes page primary team.   Edison Gore, MD 04/07/2024, 8:13 PM PGY-2, [facility] Night Resident  Please page [phone] with questions.

## 2024-04-07 NOTE — Plan of Care (Addendum)
 Received a page from the nurse patient was tachycardic to the 140s to 160s.  She is also febrile to 101.1.  She is very diaphoretic.  She has only received 1 dose of Librium  25 mg at noon today.  She has not had much to drink or eat today although she did pass SLP evaluation and started on dysphagia 1 diet.  Chest x-ray was obtained at 3:30 PM this afternoon due to nurses report of thick cough.  Respiratory PT was ordered as well.  I personally reviewed the chest x-ray which appears stable to prior obtained on admission.  Differential for this new acute presentation could be ACS, acute alcohol withdrawal, aspiration, PE.  When I evaluated the patient at bedside she appeared very diaphoretic, tachycardic, tremulous.  She did not look volume overloaded on exam.  She had no increased work of breathing although lung sounds were significant for deep inspiratory crackles.  Fever: - Blood culture - 20 pathogen panel - 500 cc bolus NS fluid - Will wait to start broad-spectrum antibiotics until we see how she is affected by benzodiazepine administration  Tachycardia: Multifactorial could be an element of dehydration with patient having poor p.o. intake.  She is also greater than 24 hours from last drink and known Librium  intake entering the window for acute withdrawal.  Could also be infectious workup as above. - Repeat EKG - Order troponin - Redose Librium  - Add Ativan  as needed for seizure  Clem Currier, DO [facility], PGY-2 04/07/24 5:00 PM     Addendum:  Liana Reding and spoke with sister, Adah Acron.  She reports sister has a long history and struggle with alcohol after the death of her 3 grandchildren in a house fire.  She is also a long-term tobacco smoker and has been coughing up phlegm for a long time. With this new information we will schedule DuoNebs every 4 hours to see if this improves her respiratory status

## 2024-04-07 NOTE — Assessment & Plan Note (Signed)
 HTN: Not on any BP meds at home. Elevated BP on arrival, CTM.

## 2024-04-07 NOTE — Plan of Care (Signed)

## 2024-04-07 NOTE — Assessment & Plan Note (Signed)
 Hgb 8.1 this morning. Baseline ~10, No signs of bleeding on exam, not currently on blood thinners. Most likely secondary to alcohol use disorder.  - trend CBC daily  - monitor for signs of bleeding  - transfuse <7, no cardiac history

## 2024-04-07 NOTE — Assessment & Plan Note (Signed)
 K 3.0 this morning. No EKG changes - 40 mEq IV repletion in process  - trend BMP

## 2024-04-07 NOTE — Assessment & Plan Note (Signed)
 Tachycardic to 110s, s/p 500 cc bolus without significant improvement, suspect element of alcohol withdrawal contributing. Patient is also anemic to 8.1.  EKG showing sinus tachycardia without irregular rhythm.  - CIWA protocol  - CCM  - monitor CBC

## 2024-04-07 NOTE — Hospital Course (Signed)
 Leah Woodward is a 68 y.o. female presenting with altered mental status and found down in her home on 5/2. Pertinent PMH/PSH includes hypertension, depression, alcohol use disorder, alcoholic liver damage.  Her hospital course is outlined below:  AMS  Alcohol Use Disorder:  Patient initially presented to the ED after being found down in her home.  She has a significant history of alcohol use disorder including alcohol withdrawal seizures.  She had been previously started on a Librium  taper but it was unclear if she was after taking this at home.  In the ED she was severely altered.  UDS was positive for benzos and THC.  CT head and C-spine without acute focal findings.  On exam during admission she had 2-3 beats of clonus on the left foot and tremors significant for possible alcohol withdrawal.  She was restarted on a Librium  taper inpatient. CIWAs remained below threshold for treatment.   On 5/5, patient's mental status declined further and was unable to manage PO. Upon discussion with patient's family, patient status changed to DNR - comfort care. Palliative care consulted and provided recommendations for ***  PCP follow-up: Optimize comfort care for patient

## 2024-04-07 NOTE — Evaluation (Signed)
 Occupational Therapy Evaluation Patient Details Name: Leah Woodward MRN: [id number] DOB: 05-17-1956 Today's Date: 04/07/2024   History of Present Illness   The pt is a 68 yo female presenting 5/2 after being found down for unknown time. Work up revealed: UDS + for benzos, THC, tachycardia, and AMS. PMH includes: alcohol use disorder, HTN, and tobacco use.     Clinical Impressions Pt questionable historian, reports living alone and using w/c for mobility (pivot transfers with RW). Pt does not drive, reports family drives her to the store. Pt currently needing up to max A for ADLs, min A for bed mobility and min-mod +2 for transfers with RW. Pt with posterior lean and reports L foot and hip pain with bed mobility/transfers. Pt presenting with impairments listed below, will follow acutely. Patient will benefit from continued inpatient follow up therapy, <3 hours/day to maximize safety/ind with ADL/functional mobility.      If plan is discharge home, recommend the following:   Two people to help with walking and/or transfers;A lot of help with bathing/dressing/bathroom;Assistance with cooking/housework;Direct supervision/assist for medications management;Direct supervision/assist for financial management;Assist for transportation;A lot of help with walking and/or transfers;Help with stairs or ramp for entrance;Supervision due to cognitive status     Functional Status Assessment   Patient has had a recent decline in their functional status and demonstrates the ability to make significant improvements in function in a reasonable and predictable amount of time.     Equipment Recommendations   Tub/shower seat     Recommendations for Other Services   PT consult     Precautions/Restrictions   Precautions Precautions: Fall Recall of Precautions/Restrictions: Impaired Restrictions Weight Bearing Restrictions Per Provider Order: No     Mobility Bed Mobility Overal bed  mobility: Needs Assistance Bed Mobility: Sit to Supine       Sit to supine: Min assist   General bed mobility comments: incr time to bring BLE into bed    Transfers Overall transfer level: Needs assistance Equipment used: Rolling walker (2 wheels) Transfers: Sit to/from Stand, Bed to chair/wheelchair/BSC Sit to Stand: Min assist, +2 safety/equipment, Mod assist     Step pivot transfers: Mod assist, +2 physical assistance, +2 safety/equipment            Balance Overall balance assessment: Needs assistance, History of Falls Sitting-balance support: Bilateral upper extremity supported, Feet supported Sitting balance-Leahy Scale: Fair   Postural control: Posterior lean Standing balance support: Bilateral upper extremity supported, During functional activity Standing balance-Leahy Scale: Poor Standing balance comment: dependent on BUE support and external assist                           ADL either performed or assessed with clinical judgement   ADL Overall ADL's : Needs assistance/impaired Eating/Feeding: Minimal assistance;Sitting   Grooming: Minimal assistance;Sitting   Upper Body Bathing: Moderate assistance   Lower Body Bathing: Maximal assistance   Upper Body Dressing : Moderate assistance   Lower Body Dressing: Maximal assistance   Toilet Transfer: Moderate assistance;+2 for physical assistance;Rolling walker (2 wheels)   Toileting- Clothing Manipulation and Hygiene: Maximal assistance       Functional mobility during ADLs: Moderate assistance;+2 for physical assistance       Vision   Additional Comments: will further assess     Perception Perception: Not tested       Praxis Praxis: Not tested       Pertinent Vitals/Pain Pain Assessment Pain Assessment: Faces  Pain Score: 6  Faces Pain Scale: Hurts even more Pain Location: bilateral feet, L ankle, L Hip Pain Descriptors / Indicators: Discomfort, Grimacing, Sharp Pain  Intervention(s): Limited activity within patient's tolerance     Extremity/Trunk Assessment Upper Extremity Assessment Upper Extremity Assessment: Generalized weakness;LUE deficits/detail;RUE deficits/detail RUE Deficits / Details: tremoring, decr FMC RUE Coordination: decreased fine motor;decreased gross motor LUE Deficits / Details: 4/5 ROM compare to RUE, tremoring, decr FMC LUE Coordination: decreased fine motor;decreased gross motor   Lower Extremity Assessment Lower Extremity Assessment: Defer to PT evaluation   Cervical / Trunk Assessment Cervical / Trunk Assessment: Kyphotic;Other exceptions Cervical / Trunk Exceptions: frail   Communication Communication Communication: No apparent difficulties   Cognition Arousal: Alert Behavior During Therapy: Flat affect Cognition: Cognition impaired, No family/caregiver present to determine baseline   Orientation impairments: Situation, Time Awareness: Online awareness impaired, Intellectual awareness impaired Memory impairment (select all impairments): Short-term memory, Working [employer] fast streamer, Copywriter, advertising, Armed forces training and education officer functioning impairment (select all impairments): Problem solving, Initiation OT - Cognition Comments: pt states it is 1925, aware she is at [place], states she is 57                 Following commands: Impaired Following commands impaired: Follows one step commands with increased time     Cueing  General Comments   Cueing Techniques: Verbal cues  VSS on RA   Exercises     Shoulder Instructions      Home Living Family/patient expects to be discharged to:: Private residence Living Arrangements: Alone Available Help at Discharge: Available PRN/intermittently;Family               Bathroom Shower/Tub: Tub/shower unit         Home Equipment: Agricultural consultant (2 wheels);Wheelchair - manual   Additional Comments: pt unreliable historian and no  family present      Prior Functioning/Environment Prior Level of Function : Independent/Modified Independent;Patient poor historian/Family not available             Mobility Comments: pt reports using a "pusher" (seems like a walker) to transfer to [facility], reports no other falls, sedentary lifestyle sitting in chair watching TV ADLs Comments: pt reports taking bath, goes shopping with sister as she doesnt drive    OT Problem List: Decreased strength;Decreased activity tolerance;Decreased range of motion;Impaired balance (sitting and/or standing);Impaired vision/perception;Decreased safety awareness;Decreased coordination;Decreased cognition   OT Treatment/Interventions: Self-care/ADL training;Therapeutic exercise;Energy conservation;DME and/or AE instruction;Therapeutic activities;Patient/family education;Balance training      OT Goals(Current goals can be found in the care plan section)   Acute Rehab OT Goals Patient Stated Goal: none stated OT Goal Formulation: With patient Time For Goal Achievement: 04/21/24 Potential to Achieve Goals: Good ADL Goals Pt Will Perform Upper Body Dressing: with supervision;sitting Pt Will Perform Lower Body Dressing: with supervision;sit to/from stand;sitting/lateral leans Pt Will Transfer to Toilet: with supervision;ambulating;regular height toilet Additional ADL Goal #1: pt will follow 2 step command with min cues in prep for ADLs   OT Frequency:  Min 2X/week    Co-evaluation              AM-PAC OT "6 Clicks" Daily Activity     Outcome Measure Help from another person eating meals?: A Little Help from another person taking care of personal grooming?: A Little Help from another person toileting, which includes using toliet, bedpan, or urinal?: A Lot Help from another person bathing (including washing, rinsing, drying)?: A Lot Help from another  person to put on and taking off regular upper body clothing?: A Lot Help from another person  to put on and taking off regular lower body clothing?: A Lot 6 Click Score: 14   End of Session Equipment Utilized During Treatment: Gait belt;Rolling walker (2 wheels) Nurse Communication: Mobility status (wound on sacrum)  Activity Tolerance: Patient tolerated treatment well Patient left: in bed;with call bell/phone within reach;with bed alarm set  OT Visit Diagnosis: Unsteadiness on feet (R26.81);Other abnormalities of gait and mobility (R26.89);Muscle weakness (generalized) (M62.81)                Time: 4098-1191 OT Time Calculation (min): 16 min Charges:  OT General Charges $OT Visit: 1 Visit OT Evaluation $OT Eval Moderate Complexity: 1 Mod  Dvonte Gatliff K, OTD, OTR/L SecureChat Preferred Acute Rehab [phone]   Sakiyah Shur K Koonce 04/07/2024, 10:40 AM

## 2024-04-07 NOTE — Consult Note (Signed)
 WOC team consulted for L buttock/L hip wound. Secure chat to primary nurse requesting photo documentation be uploaded to chart.   Please note that the [facility] nursing team is utilizing a standardized work plan to manage patient consults. We are triaging consults and will try to see the patients within 48 hours. Wound photos in the patient's chart allow us  to consult on the patient in the most efficient and timely manner.    Thank you,    Ronni Colace MSN, RN-BC, [employer] [phone]

## 2024-04-07 NOTE — Assessment & Plan Note (Addendum)
 Most likely secondary to alcohol abuse/withdrawal.  Librium  taper was started 03/31/2024 unclear if patient has been taking this appropriately.  TSH elevated with normal T4, B12 normal, UDS positive for THC and benzodiazepines. Exam positive for 2-3 beats of clonus on the left foot.  - Monitor CIWAs - Follow-up RPR and B1 labs - Start thiamine  IV, can change to PO once passes swallow  - Vitals per floor - Delirium, seizure and fall precautions  - NPO pending SLP eval  - PT/OT eval  - AM CBC, BMP - Order INR

## 2024-04-07 NOTE — Evaluation (Signed)
 Physical Therapy Evaluation Patient Details Name: Leah Woodward MRN: [id number] DOB: [birth year] Today's Date: 04/07/2024  History of Present Illness  The pt is a 68 yo female presenting 5/2 after being found down for unknown time. Work up revealed: UDS + for benzos, THC, tachycardia, and AMS. PMH includes: alcohol use disorder, HTN, and tobacco use.  Clinical Impression  Pt in bed upon arrival of PT, agreeable to evaluation at this time. Prior to admission the pt states she was transferring to [facility] with use of RW, had had no other falls, and was living alone with intermittent checks by family. The pt reports she is largely sedentary, spending most of her day in a chair. The pt now presents with limitations in functional mobility, strength, ROM, power, coordination, motor planning, and safety awareness due to above dx, and will continue to benefit from skilled PT to address these deficits. The pt required increased time and cues for all mobility as well as modA for management of trunk and LE to complete bed mobility and modA to power up to standing. The pt demos poor functional use of UE to rise to standing and was unable to manage RW without assist. Pt is unable to complete any mobility without assist at this time, would need assist at home to be able to safely return. Therefore, recommend continued inpatient rehab <3hours/day to improve functional strength, mobility, and independence with transfers prior to anticipated return home alone.          If plan is discharge home, recommend the following: Two people to help with walking and/or transfers;Two people to help with bathing/dressing/bathroom;Assistance with cooking/housework;Assistance with feeding;Direct supervision/assist for medications management;Direct supervision/assist for financial management;Assist for transportation;Help with stairs or ramp for entrance;Supervision due to cognitive status   Can travel by private vehicle   No     Equipment Recommendations Hospital bed (aide, possibly new WC cushion)  Recommendations for Other Services       Functional Status Assessment Patient has had a recent decline in their functional status and demonstrates the ability to make significant improvements in function in a reasonable and predictable amount of time.     Precautions / Restrictions Precautions Precautions: Fall Recall of Precautions/Restrictions: Impaired Restrictions Weight Bearing Restrictions Per Provider Order: No      Mobility  Bed Mobility Overal bed mobility: Needs Assistance Bed Mobility: Supine to Sit, Sit to Supine     Supine to sit: Mod assist, +2 for safety/equipment Sit to supine: Min assist   General bed mobility comments: assist to LE and trunk, significantly increased time to return LE to bed, painful with return of trunk to suipine    Transfers Overall transfer level: Needs assistance Equipment used: Rolling walker (2 wheels) Transfers: Sit to/from Stand, Bed to chair/wheelchair/BSC Sit to Stand: Min assist, +2 safety/equipment, Mod assist   Step pivot transfers: Mod assist, +2 physical assistance, +2 safety/equipment       General transfer comment: minA to rise from bed with pt using BUE on RW. poor power up to standing, then narrow BOS. modA to steady while stepping, with persistent narrow BOS. modA of 2 to rise from chair and step back to bed. scissoring noted    Ambulation/Gait Ambulation/Gait assistance: Mod assist, +2 physical assistance Gait Distance (Feet): 3 Feet (+ 23ft) Assistive device: Rolling walker (2 wheels) Gait Pattern/deviations: Step-to pattern, Decreased stride length, Leaning posteriorly, Narrow base of support, Scissoring Gait velocity: decreased Gait velocity interpretation: <1.31 ft/sec, indicative of household ambulator  General Gait Details: pt with narrow BOS, scissoring with lateral steps. single buckling episode in R knee with initial stance.  posterior lean     Balance Overall balance assessment: Needs assistance, History of Falls Sitting-balance support: Bilateral upper extremity supported, Feet supported Sitting balance-Leahy Scale: Fair   Postural control: Posterior lean Standing balance support: Bilateral upper extremity supported, During functional activity Standing balance-Leahy Scale: Poor Standing balance comment: dependent on BUE support and external assist                             Pertinent Vitals/Pain Pain Assessment Pain Assessment: Faces Faces Pain Scale: Hurts even more Pain Location: bilateral feet, L ankle Pain Descriptors / Indicators: Discomfort, Grimacing, Sharp Pain Intervention(s): Limited activity within patient's tolerance, Monitored during session, Repositioned    Home Living Family/patient expects to be discharged to:: Private residence Living Arrangements: Alone Available Help at Discharge: Available PRN/intermittently;Family             Home Equipment: Agricultural consultant (2 wheels);Wheelchair - manual Additional Comments: pt unreliable historian and no family present    Prior Function Prior Level of Function : Independent/Modified Independent;Patient poor historian/Family not available             Mobility Comments: pt reports using a "pusher" (seems like a walker) to transfer to [facility], reports no other falls, sedentary lifestyle sitting in chair watching TV ADLs Comments: pt reports taking bath, goes shopping with sister as she doesnt drive     Extremity/Trunk Assessment   Upper Extremity Assessment Upper Extremity Assessment: Defer to OT evaluation    Lower Extremity Assessment Lower Extremity Assessment: Generalized weakness (frail, LLE pain)    Cervical / Trunk Assessment Cervical / Trunk Assessment: Kyphotic;Other exceptions Cervical / Trunk Exceptions: frail  Communication   Communication Communication: No apparent difficulties    Cognition Arousal:  Alert Behavior During Therapy: Flat affect   PT - Cognitive impairments: No family/caregiver present to determine baseline, Orientation, Memory, Attention, Initiation, Sequencing, Problem solving, Safety/Judgement   Orientation impairments: Person, Time, Situation                   PT - Cognition Comments: pt states date as May 4th 1926 (it is may 3rd 2025), then states her age as 73 (she is 60), asked if it was 9:44 at night despite sunlight through window. cues for all initiation and increased time for all movements. cues for technique, problem solving Following commands: Impaired Following commands impaired: Follows one step commands with increased time     Cueing Cueing Techniques: Verbal cues     General Comments General comments (skin integrity, edema, etc.): VSS on RA        Assessment/Plan    PT Assessment Patient needs continued PT services  PT Problem List Decreased strength;Decreased range of motion;Decreased activity tolerance;Decreased balance;Decreased mobility;Decreased coordination;Decreased cognition;Decreased knowledge of use of DME;Decreased safety awareness;Decreased knowledge of precautions;Pain       PT Treatment Interventions DME instruction;Gait training;Functional mobility training;Stair training;Therapeutic activities;Therapeutic exercise;Balance training;Neuromuscular re-education;Cognitive remediation;Patient/family education    PT Goals (Current goals can be found in the Care Plan section)  Acute Rehab PT Goals Patient Stated Goal: to get warm PT Goal Formulation: With patient Time For Goal Achievement: 04/21/24 Potential to Achieve Goals: Fair    Frequency Min 1X/week        AM-PAC PT "6 Clicks" Mobility  Outcome Measure Help needed turning from your back to your  side while in a flat bed without using bedrails?: A Lot Help needed moving from lying on your back to sitting on the side of a flat bed without using bedrails?: A Lot Help  needed moving to and from a bed to a chair (including a wheelchair)?: Total Help needed standing up from a chair using your arms (e.g., wheelchair or bedside chair)?: Total Help needed to walk in hospital room?: Total Help needed climbing 3-5 steps with a railing? : Total 6 Click Score: 8    End of Session Equipment Utilized During Treatment: Gait belt Activity Tolerance: Patient tolerated treatment well;Patient limited by fatigue Patient left: in bed;with call bell/phone within reach;with bed alarm set Nurse Communication: Mobility status PT Visit Diagnosis: Unsteadiness on feet (R26.81);Other abnormalities of gait and mobility (R26.89);Muscle weakness (generalized) (M62.81);Pain Pain - Right/Left: Left Pain - part of body: Ankle and joints of foot    Time: 1308-6578 PT Time Calculation (min) (ACUTE ONLY): 11 min   Charges:   PT Evaluation $PT Eval Moderate Complexity: 1 Mod   PT General Charges $$ ACUTE PT VISIT: 1 Visit         Barnabas Booth, PT, DPT   Acute Rehabilitation Department Office [phone] Secure Chat Communication Preferred  Lona Rist 04/07/2024, 10:22 AM

## 2024-04-07 NOTE — Consult Note (Signed)
 WOC Nurse Consult Note: Reason for Consult: sacral  and hip wound Wound type: Unstageable Pressure Injury; sacrum; 100% yellow Stage 2 Pressure Injury: left hip; clean pink; partial thickness Pressure Injury POA: Yes Measurement: see nursing flow sheets Wound bed: see above  Drainage (amount, consistency, odor) see nursing flow sheets Periwound: intact  Cleanse wounds with saline, pat dry Cover hip wound with single layer of xeroform, top with foam;  Apply 1/4" thick layer of leptospermum honey to sacral wound bed daily  top with silicone foam,. Ok to lift silicone foam to reapply Medihoney daily.   Turn and reposition per hospital policy    Re consult if needed, will not follow at this time. Thanks  Kassidy Dockendorf [employer], RN,CWOCN, CNS, CWON-AP [phone])

## 2024-04-07 NOTE — Assessment & Plan Note (Addendum)
 Difficult exam secondary to pain. Unable to reproduce pain with palpation of left knee and hip. Patient has recent x-rays of left foot from 4/26 which were negative for infection.  - Order x-rays left hip, knee, ankle and foot  - consider uric acid level

## 2024-04-08 DIAGNOSIS — L8990 Pressure ulcer of unspecified site, unspecified stage: Secondary | ICD-10-CM | POA: Insufficient documentation

## 2024-04-08 DIAGNOSIS — F1090 Alcohol use, unspecified, uncomplicated: Secondary | ICD-10-CM | POA: Diagnosis not present

## 2024-04-08 DIAGNOSIS — R638 Other symptoms and signs concerning food and fluid intake: Secondary | ICD-10-CM | POA: Insufficient documentation

## 2024-04-08 DIAGNOSIS — R000 Tachycardia, unspecified: Secondary | ICD-10-CM | POA: Diagnosis not present

## 2024-04-08 DIAGNOSIS — R404 Transient alteration of awareness: Secondary | ICD-10-CM | POA: Diagnosis not present

## 2024-04-08 DIAGNOSIS — R509 Fever, unspecified: Secondary | ICD-10-CM | POA: Insufficient documentation

## 2024-04-08 LAB — RESPIRATORY PANEL BY PCR

## 2024-04-08 LAB — BASIC METABOLIC PANEL WITH GFR
Anion gap: 11 (ref 5–15)
BUN: 7 mg/dL — ABNORMAL LOW (ref 8–23)
CO2: 24 mmol/L (ref 22–32)
Calcium: 9.2 mg/dL (ref 8.9–10.3)
Chloride: 101 mmol/L (ref 98–111)
Creatinine, Ser: 0.61 mg/dL (ref 0.44–1.00)
GFR, Estimated: >60 mL/min (ref >60)
Glucose, Bld: 109 mg/dL — ABNORMAL HIGH (ref 70–99)
Potassium: 2.9 mmol/L — ABNORMAL LOW (ref 3.5–5.1)
Sodium: 136 mmol/L (ref 135–145)

## 2024-04-08 LAB — ABO/RH: ABO/RH(D): O POS

## 2024-04-08 LAB — TROPONIN I (HIGH SENSITIVITY): Troponin I (High Sensitivity): 8 ng/L (ref <18)

## 2024-04-08 LAB — CBC
HCT: 24.8 % — ABNORMAL LOW (ref 36.0–46.0)
Hemoglobin: 7.9 g/dL — ABNORMAL LOW (ref 12.0–15.0)
MCH: 28.9 pg (ref 26.0–34.0)
MCHC: 31.9 g/dL (ref 30.0–36.0)
MCV: 90.8 fL (ref 80.0–100.0)
Platelets: 292 10*3/uL (ref 150–400)
RBC: 2.73 MIL/uL — ABNORMAL LOW (ref 3.87–5.11)
RDW: 14.4 % (ref 11.5–15.5)
WBC: 8.5 10*3/uL (ref 4.0–10.5)
nRBC: 0 % (ref 0.0–0.2)

## 2024-04-08 LAB — HEMOGLOBIN AND HEMATOCRIT, BLOOD
HCT: 24.3 % — ABNORMAL LOW (ref 36.0–46.0)
HCT: 30.1 % — ABNORMAL LOW (ref 36.0–46.0)
Hemoglobin: 7.7 g/dL — ABNORMAL LOW (ref 12.0–15.0)
Hemoglobin: 10 g/dL — ABNORMAL LOW (ref 12.0–15.0)

## 2024-04-08 LAB — PREPARE RBC (CROSSMATCH)

## 2024-04-08 MED ORDER — ACETAMINOPHEN 325 MG PO TABS
650.0000 mg | ORAL_TABLET | Freq: Four times a day (QID) | ORAL | Status: DC | PRN
  Administered 2024-04-08 (×2): 650 mg via ORAL
  Filled 2024-04-08 (×2): qty 2

## 2024-04-08 MED ORDER — THIAMINE MONONITRATE 100 MG PO TABS
100.0000 mg | ORAL_TABLET | Freq: Every day | ORAL | Status: DC
  Filled 2024-04-08: qty 1

## 2024-04-08 MED ORDER — POTASSIUM CHLORIDE CRYS ER 20 MEQ PO TBCR
40.0000 meq | EXTENDED_RELEASE_TABLET | Freq: Once | ORAL | Status: AC
  Administered 2024-04-08: 40 meq via ORAL
  Filled 2024-04-08: qty 2

## 2024-04-08 MED ORDER — LACTATED RINGERS IV BOLUS
500.0000 mL | Freq: Once | INTRAVENOUS | Status: AC
  Administered 2024-04-08: 500 mL via INTRAVENOUS

## 2024-04-08 MED ORDER — DEXTROSE IN LACTATED RINGERS 5 % IV SOLN
INTRAVENOUS | Status: AC
Start: 2024-04-08 — End: 2024-04-09

## 2024-04-08 MED ORDER — SODIUM CHLORIDE 0.9% IV SOLUTION: Freq: Once | INTRAVENOUS | Status: AC

## 2024-04-08 MED ORDER — POTASSIUM CHLORIDE 10 MEQ/100ML IV SOLN
10.0000 meq | INTRAVENOUS | Status: AC
  Administered 2024-04-08 (×4): 10 meq via INTRAVENOUS
  Filled 2024-04-08 (×3): qty 100

## 2024-04-08 NOTE — Assessment & Plan Note (Addendum)
 AM K 2.9, repleted. Suspect 2/2 poor PO.  - Trend BMP

## 2024-04-08 NOTE — Assessment & Plan Note (Addendum)
 Able to take crushed meds, but is declining food per nursing. Per family, she does not eat well chronically. - 500cc LR bolus - 100cc/hr D5LR mIVF until PO improves - RD consult

## 2024-04-08 NOTE — Assessment & Plan Note (Addendum)
 HTN: Not on any BP meds at home. Elevated BP on arrival, CTM.

## 2024-04-08 NOTE — Assessment & Plan Note (Deleted)
 Some reported history of withdrawal, no known hospitalizations.  - CIWAs - consider prn Ativan  as needed - Thiamine  level in process

## 2024-04-08 NOTE — Assessment & Plan Note (Addendum)
 Mentation is waxing and waning over past 24hrs, but overall improving from admission. CIWAs 5-6 ON.   Leading differentials for AMS include alcohol withdrawal and Warnicke Korsakoff. Could also be exacerbated by hospital delirium. RPR neg.   - Cont Librium  taper (TID, then BID, then daily, then stop) - Cont Atarax 25 mg every 6 hours as needed for anxiety - Cont thiamine  daily (transition to PO since tolerating PO) - CIWAs q4h - Vitals per floor - Delirium, seizure and fall precautions  - PT/OT eval  - AM CBC, BMP - f/u thiamine   - will update family

## 2024-04-08 NOTE — Assessment & Plan Note (Addendum)
 Likely related to sacral and left hip pressure ulcers.  Left hip, left foot, left knee x-ray unremarkable. - Continue wound care

## 2024-04-08 NOTE — Progress Notes (Signed)
 Pt is A&O to person, confused and forgetful. Pt is tachycardic, denies CP and palpitations. Pt is on CIWA protocol with PRN medication once she scores 10 and above. PT scoring only 5-6. Pt's MEWs remains yellow from day and trough out the  night shift, MD aware. HR in 118-130s, SBP 140-150s. Got order to give Potassium supplement, since her K+ is  2.9. No other orders been received. Will monitor closely.

## 2024-04-08 NOTE — Assessment & Plan Note (Addendum)
 Likely 2/2 alcohol withdrawal. Had minimal improvement with IVF yesterday. EKG cw sinus tach. Could also be related to anemia (see below) - Tx alcohol withdrawal and anemia - monitor CBC

## 2024-04-08 NOTE — NC FL2 (Signed)
 [place]  MEDICAID FL2 LEVEL OF CARE FORM     IDENTIFICATION  Patient Name: Leah Woodward [id number] Birthdate: [birth year] Sex: female Admission Date (Current Location): 04/06/2024  [facility] and [place] Number:  Producer, television/film/video and Address:  [facility] [place], [address], [place], [place] [address]      Provider Number: [id number]  Attending Physician Name and Address:  Charise Companion, MD  Relative Name and Phone Number:  Bradley Caffey (Sister)  [phone]    Current Level of Care: Hospital Recommended Level of Care: Skilled Nursing Facility Prior Approval Number:    Date Approved/Denied:   PASRR Number: [id number]  Discharge Plan: SNF    Current Diagnoses: Patient Active Problem List   Diagnosis Date Noted   Fever 04/08/2024   Pressure injury of skin 04/08/2024   Decreased oral intake 04/08/2024   Hypokalemia 04/07/2024   Left leg pain 04/07/2024   Altered mental status  Alcohol Use Disorder 04/06/2024   Chronic health problem 04/06/2024   Alcohol use disorder 04/06/2024   Anemia 04/07/2022   Secondary hemochromatosis 02/16/2022   Mood disturbance 01/07/2022   Thrombocytopenia (HCC) 01/07/2022   Unintended weight loss 10/29/2021   Transaminitis 05/12/2021   Tachycardia 05/12/2021   Genital lesion, female 02/23/2021   Genital herpes simplex 01/26/2021   Healthcare maintenance 01/26/2021   Alcohol abuse 01/26/2021   Difficulty sleeping 01/26/2021   TOBACCO DEPENDENCE 02/02/2007   HEMORRHOIDS, NOS 02/02/2007   COPD 02/02/2007   PAPANICOLAOU SMEAR, ABNORMAL 02/02/2007   PAPANICOLAOU SMEAR, ABNORMAL 02/02/2007    Orientation RESPIRATION BLADDER Height & Weight     Self  Normal Incontinent Weight:   Height:     BEHAVIORAL SYMPTOMS/MOOD NEUROLOGICAL BOWEL NUTRITION STATUS      Continent Diet (see DC summary)  AMBULATORY STATUS COMMUNICATION OF NEEDS Skin   Extensive Assist Verbally Other (Comment), PU Stage and Appropriate Care  (Wound / Incision (Open or Dehisced) 04/07/24 Coccyx Dry, whjite, non-blanchable. Hip Left Stage 2 - Partial thickness loss of dermis presenting as a shallow open injury with a red, pink wound bed without slough.)   PU Stage 2 Dressing: Daily                   Personal Care Assistance Level of Assistance  Bathing, Feeding, Dressing Bathing Assistance: Maximum assistance Feeding assistance: Limited assistance Dressing Assistance: Maximum assistance     Functional Limitations Info  Hearing, Sight, Speech Sight Info: Adequate Hearing Info: Adequate Speech Info: Adequate    SPECIAL CARE FACTORS FREQUENCY  PT (By licensed PT), OT (By licensed OT)     PT Frequency: 5x/week OT Frequency: 5x/week            Contractures Contractures Info: Not present    Additional Factors Info  Code Status, Allergies Code Status Info: FULL Allergies Info: NKA           Current Medications (04/08/2024):  This is the current hospital active medication list Current Facility-Administered Medications  Medication Dose Route Frequency Provider Last Rate Last Admin   0.9 %  sodium chloride  infusion (Manually program via Guardrails IV Fluids)   Intravenous Once Zheng, Jacky, MD       acetaminophen  (TYLENOL ) tablet 650 mg  650 mg Oral Q6H PRN Quillen, Michael, MD   650 mg at 04/08/24 1058   chlordiazePOXIDE  (LIBRIUM ) capsule 25 mg  25 mg Oral Q6H PRN Clem Currier, DO   25 mg at 04/07/24 1652   chlordiazePOXIDE  (LIBRIUM ) capsule 25 mg  25 mg Oral TID Clem Currier, DO   25 mg at 04/08/24 1038   Followed by   Cecily Cohen ON 04/09/2024] chlordiazePOXIDE  (LIBRIUM ) capsule 25 mg  25 mg Oral Margrett Sherry, DO       Followed by   Cecily Cohen ON 04/10/2024] chlordiazePOXIDE  (LIBRIUM ) capsule 25 mg  25 mg Oral Daily Clem Currier, DO       dextrose 5 % in lactated ringers  infusion   Intravenous Continuous Omar Bibber, DO 125 mL/hr at 04/08/24 1323 New Bag at 04/08/24 1323   hydrOXYzine (ATARAX) tablet 25 mg   25 mg Oral Q6H PRN Clem Currier, DO       leptospermum manuka honey (MEDIHONEY) paste 1 Application  1 Application Topical Daily Charise Companion, MD   1 Application at 04/08/24 1039   loperamide (IMODIUM) capsule 2-4 mg  2-4 mg Oral PRN Clem Currier, DO       ondansetron  (ZOFRAN -ODT) disintegrating tablet 4 mg  4 mg Oral Q6H PRN Clem Currier, DO       [START ON 04/09/2024] thiamine  (VITAMIN B1) tablet 100 mg  100 mg Oral Daily Zheng, Jacky, MD         Discharge Medications: Please see discharge summary for a list of discharge medications.  Relevant Imaging Results:  Relevant Lab Results:   Additional Information ZHY:[id number]  Farron Watrous A [place], LCSW

## 2024-04-08 NOTE — Progress Notes (Signed)
 SLP Cancellation Note  Patient Details Name: KAYANNE GOW MRN: [id number] DOB: Jul 22, 1956   Cancelled treatment:       Reason Eval/Treat Not Completed: Patient's level of consciousness SLP attempted to work with patient however she was very lethargic and not alert enough for PO's. Her voice sounds wet and SLP did orally suction minimal amount of secretions.   Jacqualine Mater, MA, CCC-SLP Speech Therapy

## 2024-04-08 NOTE — Plan of Care (Signed)
  Problem: Clinical Measurements: Goal: Will remain free from infection Outcome: Progressing Goal: Respiratory complications will improve Outcome: Progressing   Problem: Coping: Goal: Level of anxiety will decrease Outcome: Progressing   Problem: Elimination: Goal: Will not experience complications related to urinary retention Outcome: Progressing   Problem: Safety: Goal: Ability to remain free from injury will improve Outcome: Progressing   Problem: Skin Integrity: Goal: Risk for impaired skin integrity will decrease Outcome: Progressing   Problem: Education: Goal: Knowledge of General Education information will improve Description: Including pain rating scale, medication(s)/side effects and non-pharmacologic comfort measures Outcome: Not Progressing   Problem: Health Behavior/Discharge Planning: Goal: Ability to manage health-related needs will improve Outcome: Not Progressing   Problem: Clinical Measurements: Goal: Ability to maintain clinical measurements within normal limits will improve Outcome: Not Progressing Goal: Diagnostic test results will improve Outcome: Not Progressing Goal: Cardiovascular complication will be avoided Outcome: Not Progressing   Problem: Activity: Goal: Risk for activity intolerance will decrease Outcome: Not Progressing   Problem: Nutrition: Goal: Adequate nutrition will be maintained Outcome: Not Progressing   Problem: Elimination: Goal: Will not experience complications related to bowel motility Outcome: Not Progressing   Problem: Pain Managment: Goal: General experience of comfort will improve and/or be controlled Outcome: Not Progressing

## 2024-04-08 NOTE — TOC Initial Note (Signed)
 Transition of Care [facility]) - Initial/Assessment Note    Patient Details  Name: Leah Woodward MRN: [id number] Date of Birth: 07/13/1956  Transition of [facility]) CM/SW Contact:    Noah Lembke A [place], LCSW Phone Number: 04/08/2024, 2:06 PM  Clinical Narrative:                  CSW contacted [name] to complete assessment as pt is still disoriented. She informed CSW that pt lives alone and would need short term rehab as family cannot assist or supervise 24/7. Preference for [facility], does not want [facility].   SNF workup completed. Bed offers pending.    TOC will continue to follow.   Expected Discharge Plan: Skilled Nursing Facility Barriers to Discharge: Continued Medical Work up, SNF Pending bed offer, Insurance Authorization   Patient Goals and CMS Choice            Expected Discharge Plan and Services In-house Referral: Clinical Social Work                                            Prior Living Arrangements/Services   Lives with:: Self          Need for Family Participation in Patient Care: Yes (Comment) Care giver support system in place?: Yes (comment) ([name])      Activities of Daily Living   ADL Screening (condition at time of admission) Does the patient have difficulty seeing, even when wearing glasses/contacts?: No  Permission Sought/Granted                  Emotional Assessment   Attitude/Demeanor/Rapport: Unable to Assess Affect (typically observed): Unable to Assess Orientation: : Oriented to Self Alcohol / Substance Use: Alcohol Use Psych Involvement: No (comment)  Admission diagnosis:  Altered mental status [R41.82] Altered mental status, unspecified altered mental status type [R41.82] Patient Active Problem List   Diagnosis Date Noted   Fever 04/08/2024   Pressure injury of skin 04/08/2024   Decreased oral intake 04/08/2024   Hypokalemia 04/07/2024   Left leg pain 04/07/2024    Altered mental status  Alcohol Use Disorder 04/06/2024   Chronic health problem 04/06/2024   Alcohol use disorder 04/06/2024   Anemia 04/07/2022   Secondary hemochromatosis 02/16/2022   Mood disturbance 01/07/2022   Thrombocytopenia (HCC) 01/07/2022   Unintended weight loss 10/29/2021   Transaminitis 05/12/2021   Tachycardia 05/12/2021   Genital lesion, female 02/23/2021   Genital herpes simplex 01/26/2021   Healthcare maintenance 01/26/2021   Alcohol abuse 01/26/2021   Difficulty sleeping 01/26/2021   TOBACCO DEPENDENCE 02/02/2007   HEMORRHOIDS, NOS 02/02/2007   COPD 02/02/2007   PAPANICOLAOU SMEAR, ABNORMAL 02/02/2007   PAPANICOLAOU SMEAR, ABNORMAL 02/02/2007   PCP:  Ernestina Headland, MD Pharmacy:   [facility] [phone] Jonette Nestle, [place] - [address] AT [facility] [place] [facility] [address] [place] [address] Phone: [phone] Fax: [phone]     Social Drivers of Health (SDOH) Social History: SDOH Screenings   Depression (PHQ2-9): Medium Risk (03/22/2022)  Tobacco Use: High Risk (03/31/2024)   SDOH Interventions:     Readmission Risk Interventions     No data to display

## 2024-04-08 NOTE — Progress Notes (Addendum)
 Daily Progress Note Intern Pager: [phone]  Patient name: Leah Woodward Medical record number: [id number] Date of birth: [birth year] Age: 68 y.o. Gender: female  Primary Care Provider: Ernestina Headland, MD Consultants: None Code Status: Full code  Pt Overview and Major Events to Date:  69/2 Admitted.  Assessment and Plan: WS is a 68yo F admitted for AMS and alcohol withdrawal.  Pertinent PMH/PSH includes hypertension, depression, alcohol use disorder, alcoholic liver damage, CAD, tobacco use disorder. Assessment & Plan Altered mental status  Alcohol Use Disorder Mentation is waxing and waning over past 24hrs, but overall improving from admission. CIWAs 5-6 ON.   Leading differentials for AMS include alcohol withdrawal and Warnicke Korsakoff. Could also be exacerbated by hospital delirium. RPR neg.   - Cont Librium  taper (TID, then BID, then daily, then stop) - Cont Atarax 25 mg every 6 hours as needed for anxiety - Cont thiamine  daily (transition to PO since tolerating PO) - CIWAs q4h - Vitals per floor - Delirium, seizure and fall precautions  - PT/OT eval  - AM CBC, BMP - f/u thiamine   - will update family Tachycardia Likely 2/2 alcohol withdrawal. Had minimal improvement with IVF yesterday. EKG cw sinus tach. Could also be related to anemia (see below) - Tx alcohol withdrawal and anemia - monitor CBC Anemia Hgb downtrending to 7.7. Has hx of CAD.  - Transfuse 1u pRBC - Monitor for bleeding - Transfuse <8 - Stop lovenox for DVT ppx, transition to SCDs.  Fever Had fever yesterday (Tmax 101.1), resolved with Tylenol . No new infectious symptoms, no leukocytosis, CXR unremarkable.  Suspect related to alcohol withdrawal. - Follow-up RPP and blood cultures -Tylenol  as needed for fever - Monitor fever curve Left leg pain Likely related to sacral and left hip pressure ulcers.  Left hip, left foot, left knee x-ray unremarkable. - Continue wound  care Hypokalemia AM K 2.9, repleted. Suspect 2/2 poor PO.  - Trend BMP Decreased oral intake Able to take crushed meds, but is declining food per nursing. Per family, she does not eat well chronically. - 500cc LR bolus - 100cc/hr D5LR mIVF until PO improves - RD consult Chronic health problem HTN: Not on any BP meds at home. Elevated BP on arrival, CTM.    FEN/GI: Dysphagia 1 PPx: SCDs Dispo: Pending clinical improvement of altered mental status  Subjective:  Alert and resting comfortably in bed this morning.  Denies any acute concerns, pleasantly confused.  Per nuring, pt had minimal PO intake yesterday. Did not eat meals and refused dinner when night nurse tried to feed.   Spoke with sister Adah Acron. She reports that pt may not be eating very much at home since she is drinking alcohol. She is also agreeable to blood transfusion, risks and benefits were discussed.   Objective: Temp:  [97.3 F (36.3 C)-101.1 F (38.4 C)] 97.9 F (36.6 C) (05/04 0903) Pulse Rate:  [120-142] 120 (05/04 0458) Resp:  [16-22] 16 (05/04 0458) BP: (128-167)/(80-99) 128/93 (05/04 0903) SpO2:  [93 %-100 %] 93 % (05/04 0903) Physical Exam: General: Alert, resting comfortably in bed.  NAD. Neuro: A&O to self, place ("[facility]"); but not to time or situation.  Cardiovascular: RRR, no murmurs Respiratory: CTAB. Normal WOB on RA. Abdomen: Soft, nontender, nondistended.   Laboratory: Most recent CBC Lab Results  Component Value Date   WBC 8.5 04/07/2024   HGB 7.7 (L) 04/08/2024   HCT 24.3 (L) 04/08/2024   MCV 90.8 04/07/2024   PLT 292 04/07/2024  Most recent BMP    Latest Ref Rng & Units 04/07/2024   11:45 PM  BMP  Glucose 70 - 99 mg/dL 098   BUN 8 - 23 mg/dL 7   Creatinine 1.19 - 1.47 mg/dL 8.29   Sodium 562 - 130 mmol/L 136   Potassium 3.5 - 5.1 mmol/L 2.9   Chloride 98 - 111 mmol/L 101   CO2 22 - 32 mmol/L 24   Calcium 8.9 - 10.3 mg/dL 9.2      Albin Huh, MD 04/08/2024, 9:27  AM  PGY-2, [facility] FPTS Intern pager: [phone], text pages welcome Secure chat group [facility] Teaching Service

## 2024-04-08 NOTE — Assessment & Plan Note (Addendum)
 Hgb downtrending to 7.7. Has hx of CAD.  - Transfuse 1u pRBC - Monitor for bleeding - Transfuse <8 - Stop lovenox for DVT ppx, transition to SCDs.

## 2024-04-08 NOTE — Assessment & Plan Note (Signed)
 Had fever yesterday (Tmax 101.1), resolved with Tylenol . No new infectious symptoms, no leukocytosis, CXR unremarkable.  Suspect related to alcohol withdrawal. - Follow-up RPP and blood cultures -Tylenol  as needed for fever - Monitor fever curve

## 2024-04-09 ENCOUNTER — Other Ambulatory Visit: Payer: Self-pay

## 2024-04-09 ENCOUNTER — Inpatient Hospital Stay (HOSPITAL_COMMUNITY)

## 2024-04-09 ENCOUNTER — Encounter (HOSPITAL_COMMUNITY): Payer: Self-pay | Admitting: Family Medicine

## 2024-04-09 DIAGNOSIS — R638 Other symptoms and signs concerning food and fluid intake: Secondary | ICD-10-CM | POA: Diagnosis not present

## 2024-04-09 DIAGNOSIS — F1090 Alcohol use, unspecified, uncomplicated: Secondary | ICD-10-CM | POA: Diagnosis not present

## 2024-04-09 DIAGNOSIS — E440 Moderate protein-calorie malnutrition: Secondary | ICD-10-CM | POA: Diagnosis not present

## 2024-04-09 DIAGNOSIS — R400 Somnolence: Secondary | ICD-10-CM | POA: Diagnosis not present

## 2024-04-09 LAB — TYPE AND SCREEN
ABO/RH(D): O POS
Antibody Screen: NEGATIVE
Unit division: 0

## 2024-04-09 LAB — BPAM RBC
Blood Product Expiration Date: 202505302359
ISSUE DATE / TIME: 202505041509
Unit Type and Rh: 5100

## 2024-04-09 LAB — BLOOD GAS, VENOUS
Acid-Base Excess: 3.5 mmol/L — ABNORMAL HIGH (ref 0.0–2.0)
Bicarbonate: 27.8 mmol/L (ref 20.0–28.0)
O2 Saturation: 91.9 %
Patient temperature: 36.5
pCO2, Ven: 39 mmHg — ABNORMAL LOW (ref 44–60)
pH, Ven: 7.46 — ABNORMAL HIGH (ref 7.25–7.43)
pO2, Ven: 62 mmHg — ABNORMAL HIGH (ref 32–45)

## 2024-04-09 LAB — GLUCOSE, CAPILLARY: Glucose-Capillary: 124 mg/dL — ABNORMAL HIGH (ref 70–99)

## 2024-04-09 LAB — BASIC METABOLIC PANEL WITH GFR
Anion gap: 9 (ref 5–15)
BUN: <5 mg/dL — ABNORMAL LOW (ref 8–23)
CO2: 23 mmol/L (ref 22–32)
Calcium: 9.2 mg/dL (ref 8.9–10.3)
Chloride: 104 mmol/L (ref 98–111)
Creatinine, Ser: 0.49 mg/dL (ref 0.44–1.00)
GFR, Estimated: >60 mL/min (ref >60)
Glucose, Bld: 117 mg/dL — ABNORMAL HIGH (ref 70–99)
Potassium: 3.5 mmol/L (ref 3.5–5.1)
Sodium: 136 mmol/L (ref 135–145)

## 2024-04-09 LAB — CBC
HCT: 28.4 % — ABNORMAL LOW (ref 36.0–46.0)
Hemoglobin: 9.5 g/dL — ABNORMAL LOW (ref 12.0–15.0)
MCH: 29.6 pg (ref 26.0–34.0)
MCHC: 33.5 g/dL (ref 30.0–36.0)
MCV: 88.5 fL (ref 80.0–100.0)
Platelets: 280 10*3/uL (ref 150–400)
RBC: 3.21 MIL/uL — ABNORMAL LOW (ref 3.87–5.11)
RDW: 14.7 % (ref 11.5–15.5)
WBC: 12.8 10*3/uL — ABNORMAL HIGH (ref 4.0–10.5)
nRBC: 0.2 % (ref 0.0–0.2)

## 2024-04-09 MED ORDER — GLYCOPYRROLATE 0.2 MG/ML IJ SOLN: 0.2000 mg | INTRAMUSCULAR | Status: DC | PRN

## 2024-04-09 MED ORDER — SODIUM CHLORIDE 0.9 % IV SOLN: 500.0000 mg | Freq: Every day | INTRAVENOUS | Status: DC

## 2024-04-09 MED ORDER — GLYCOPYRROLATE 1 MG PO TABS: 1.0000 mg | ORAL_TABLET | ORAL | Status: DC | PRN

## 2024-04-09 MED ORDER — DEXTROSE IN LACTATED RINGERS 5 % IV SOLN: INTRAVENOUS | Status: DC

## 2024-04-09 MED ORDER — LORAZEPAM 2 MG/ML IJ SOLN: 1.0000 mg | Freq: Once | INTRAMUSCULAR | Status: DC

## 2024-04-09 MED ORDER — ACETAMINOPHEN 325 MG PO TABS: 650.0000 mg | ORAL_TABLET | Freq: Four times a day (QID) | ORAL | Status: DC | PRN

## 2024-04-09 MED ORDER — IPRATROPIUM-ALBUTEROL 0.5-2.5 (3) MG/3ML IN SOLN: 3.0000 mL | Freq: Four times a day (QID) | RESPIRATORY_TRACT | Status: DC | PRN

## 2024-04-09 MED ORDER — HALOPERIDOL LACTATE 5 MG/ML IJ SOLN: 2.5000 mg | INTRAMUSCULAR | Status: DC | PRN

## 2024-04-09 MED ORDER — LORAZEPAM 2 MG/ML IJ SOLN: 2.0000 mg | INTRAMUSCULAR | Status: DC | PRN

## 2024-04-09 MED ORDER — THIAMINE HCL 100 MG/ML IJ SOLN
500.0000 mg | Freq: Three times a day (TID) | INTRAVENOUS | Status: DC
  Administered 2024-04-09: 500 mg via INTRAVENOUS
  Filled 2024-04-09 (×3): qty 5

## 2024-04-09 MED ORDER — POLYETHYLENE GLYCOL 3350 17 G PO PACK: 17.0000 g | PACK | Freq: Every day | ORAL | Status: DC

## 2024-04-09 MED ORDER — FOLIC ACID 5 MG/ML IJ SOLN
1.0000 mg | Freq: Every day | INTRAMUSCULAR | Status: DC
  Administered 2024-04-09: 1 mg via INTRAVENOUS
  Filled 2024-04-09: qty 0.2

## 2024-04-09 MED ORDER — IPRATROPIUM-ALBUTEROL 0.5-2.5 (3) MG/3ML IN SOLN: 3.0000 mL | RESPIRATORY_TRACT | Status: DC

## 2024-04-09 MED ORDER — FOLIC ACID 1 MG PO TABS: 1.0000 mg | ORAL_TABLET | Freq: Every day | ORAL | Status: DC

## 2024-04-09 MED ORDER — ADULT MULTIVITAMIN W/MINERALS CH: 1.0000 | ORAL_TABLET | Freq: Every day | ORAL | Status: DC

## 2024-04-09 MED ORDER — POLYVINYL ALCOHOL 1.4 % OP SOLN: 1.0000 [drp] | Freq: Four times a day (QID) | OPHTHALMIC | Status: DC | PRN

## 2024-04-09 MED ORDER — ACETAMINOPHEN 650 MG RE SUPP: 650.0000 mg | Freq: Four times a day (QID) | RECTAL | Status: DC | PRN

## 2024-04-09 NOTE — Plan of Care (Addendum)
 Received secure chat from nurse, patient is somnolent and is difficult to arouse.  Went to bedside to assess patient.  She is sleeping in no acute distress.  No increased work of breathing however she is coughing up secretions.  On exam she is cachectic, appears well-perfused, lung sounds are decreased bilaterally.  Difficult balance with alcohol use disorder with history of seizure and AMS.  As of now since patient is so somnolent we will hold Ativan  for alcohol withdrawal taper.  Will workup change in mental status with VBG possible component of hypoventilation v. Aspiration.  Due to decreased lung sounds will order a stat chest x-ray and DuoNebs.  Will reassess after intervention.  Will also need goals of care conversation with family to determine next steps.  Patient is not currently a good candidate for CPR.   Clem Currier, DO [facility], PGY-2 04/09/24 1:09 PM

## 2024-04-09 NOTE — Progress Notes (Signed)
 Psychosocial Progressive/Outcome: ANOx1/2 at times  Family at bedside   Pain/Comfort Progression/Outcome: Pt did not complain or show signs of pain   Clinical Progression/Outcome: Not alert enough for oral intake  Turned and repositioned during shift  Incon X2  Dressing changed - D/C/I Slept in between care Maintained safety  Suction at bed side

## 2024-04-09 NOTE — Assessment & Plan Note (Addendum)
 Continues to decline food intake. Per family, she does not eat well chronically. Belly distended this morning, unknown last BM per nursing.  - 125 mL/hr D5LR mIVF until PO improves - Follow RD recs - Consider CT Abd/Pel for abdominal changes

## 2024-04-09 NOTE — Progress Notes (Signed)
 Initial Nutrition Assessment  DOCUMENTATION CODES:   Non-severe (moderate) malnutrition in context of social or environmental circumstances  INTERVENTION:  Recommend placement of Cortrak and initiate enteral nutrition if within GOC: Start Osmolite 1.2 at 20 mL/hr and advance by 10 mL every 12 hours to goal rate of 50 mL/hr (1200 mL per day) 60 mL ProSource TF20 - Daily  Free water Flush: 130 mL q6h  Regimen at goal provides 1520 kcal, 87 gm protein, and 1504 mL total free water daily. Monitor magnesium, potassium, and phosphorus BID for at least 3 days, MD to replete as needed, as pt is at risk for refeeding syndrome given malnutrition and poor PO intake x 3 days.  Contiue Folic acid , Thiamine , Multivitamin w/ minerals daily  NUTRITION DIAGNOSIS:  Moderate Malnutrition related to social / environmental circumstances as evidenced by moderate fat depletion, moderate muscle depletion.  GOAL:  Patient will meet greater than or equal to 90% of their needs  MONITOR:   Weight trends, Labs, I & O's, TF tolerance, Diet advancement  REASON FOR ASSESSMENT:   Consult Assessment of nutrition requirement/status  ASSESSMENT:   68 y.o. female presented to the ED with AMS. PMH includes HTN, COPD, EtOH abuse, and depression. Pt admitted with altered mental status.   5/02 - Admitted 5/03 - SLP BSE, diet advanced to Dysphagia 1/thin liquids 5/05 - SLP BSE, downgraded to NPO  Pt laying in bed, RD at bedside. Pt does not wake to RD voice or touch. RN shares pt continues to wax and wean. No family at bedside. Per notes, pt does not eat well at baseline.  Limited weight history in chart, unable to assess for weight loss over the past year.   RD discussed placement of Cortrak with team, team plans to discussed with family prior to placement. Plan for conversation with family this afternoon.  Medications reviewed and include: Folic acid , MVI, Miralax, IV thiamine  Labs reviewed: Sodium 136,  Potassium 3.5, BUN <5   NUTRITION - FOCUSED PHYSICAL EXAM:  Flowsheet Row Most Recent Value  Orbital Region Mild depletion  Upper Arm Region Moderate depletion  Thoracic and Lumbar Region Moderate depletion  Buccal Region Mild depletion  Temple Region Mild depletion  Clavicle Bone Region Moderate depletion  Clavicle and Acromion Bone Region Moderate depletion  Scapular Bone Region Moderate depletion  Dorsal Hand Mild depletion  Patellar Region Moderate depletion  Anterior Thigh Region Moderate depletion  Posterior Calf Region Moderate depletion  Edema (RD Assessment) None  Hair Reviewed  Eyes Unable to assess  Mouth Unable to assess  Skin Reviewed  Nails Unable to assess  [nail polish]   Diet Order:   Diet Order             Diet NPO time specified Except for: Other (See Comments)  Diet effective now                   EDUCATION NEEDS:   Not appropriate for education at this time  Skin:  Skin Assessment: Skin Integrity Issues: Skin Integrity Issues:: Stage II Stage II: L hip  Last BM:  Unknown  Height:   Ht Readings from Last 1 Encounters:  04/09/24 5\' 5"  (1.651 m)    Weight:   Wt Readings from Last 1 Encounters:  04/09/24 45.2 kg   Ideal Body Weight:  56.8 kg  BMI:  Body mass index is 16.58 kg/m.  Estimated Nutritional Needs:  Kcal:  1500-1700 Protein:  75-95 grams Fluid:  >/= 1.5 L  Doneta Furbish RD, LDN Clinical Dietitian

## 2024-04-09 NOTE — Assessment & Plan Note (Addendum)
 S/p 1u PRBC with improvement of Hgb, stable at 9.5 this morning. Has hx of CAD. No known active bleeding symptoms.  - Monitor for bleeding - Transfuse <8 - Cont SCDs for VTE prophylaxis

## 2024-04-09 NOTE — Assessment & Plan Note (Addendum)
 Continue mild tachycardia, likely 2/2 alcohol withdrawal. Had minimal improvement with IVF previously. EKG cw sinus tach. Could also be related to anemia (see below) - Tx alcohol withdrawal and anemia - Cont D5LR mIVF given decreased PO intake  - am CBC

## 2024-04-09 NOTE — Assessment & Plan Note (Signed)
 Likely related to sacral and left hip pressure ulcers.  Left hip, left foot, left knee x-ray unremarkable. - Continue wound care

## 2024-04-09 NOTE — Assessment & Plan Note (Addendum)
 Mentation continues to wax and wane, overall improving from admission. CIWAs 1-2 ON, with morning score of 5. Leading differentials for AMS include alcohol withdrawal and Warnicke Korsakoff. Could also be exacerbated by hospital delirium.  - Cont Librium  taper (TID, then BID, then daily, then stop - end 5/7) - Consider mood assessment and possible SSRI following Librium  taper  - Cont Atarax 25 mg every 6 hours as needed for anxiety - High dose IV thiamine  daily  - CIWAs q4h - Delirium, seizure and fall precautions  - PT/OT eval  - AM CBC, BMP

## 2024-04-09 NOTE — Assessment & Plan Note (Signed)
 Had fever 5/3 (Tmax 101.1), resolved with Tylenol . No new infectious symptoms, no leukocytosis, CXR unremarkable.  RPP negative. Suspect related to alcohol withdrawal. - Blood cultures NG < 12 h -Tylenol  as needed for fever - Monitor fever curve

## 2024-04-09 NOTE — Significant Event (Signed)
 Rapid Response Event Note   Reason for Call :  Red mews  Initial Focused Assessment:  Lying in bed, opens eyes briefly to voice though at times requires repeated tactile stimuli for any verbal response. Alert to self and place. Able to follow few commands with repeated stimulation. Lungs decreased, heart tones fast. CIWA 3 currently.  134/93 (103) HR 120 RR 24 O2 93%  CBG 124  Interventions/Plan of Care:  VBG in process CXR MD to bedside Hold dose of ativan  Transfer PCU Discuss plan with family per MD Possible airway concern d/t secretions/protection  Event Summary:  MD Notified: Ara Bays DO Call Time: 1245 Arrival Time: 1248 End Time: 1315  Ever Hiss, RN

## 2024-04-09 NOTE — Assessment & Plan Note (Addendum)
 AM K 3.5, stable s/p repletion. Suspect 2/2 poor PO.  - AM BMP

## 2024-04-09 NOTE — Plan of Care (Signed)
 Called and spoke with patient's sister, Bradley Caffey.   Provided clinical updates including recent decline in mental status, plan to obtain additional workup, and transition to NPO status at this time with IV meds as needed.   Discussed the option for feeding tube as suggested by SLP. Per Adah Acron, "if it's something that would help her, it's something the family would like."   Also discussed meeting with family members for patient GOC discussion. Adah Acron very amenable to this and plans to visit patient later today. We discussed beginning GOC discussion at that time and allowing Adah Acron time to then discuss with other family members.

## 2024-04-09 NOTE — Assessment & Plan Note (Signed)
 HTN: Not on any BP meds at home. Normotensive overnight, CTM.

## 2024-04-09 NOTE — Plan of Care (Signed)
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
## 2024-04-09 NOTE — Progress Notes (Signed)
 Tired to call 2C twice to give report. Call was never picked up, will try to call back again

## 2024-04-09 NOTE — Progress Notes (Signed)
 Daily Progress Note Intern Pager: [phone]  Patient name: Leah Woodward Medical record number: [id number] Date of birth: 29-Jan-1956 Age: 68 y.o. Gender: female  Primary Care Provider: Ernestina Headland, MD Consultants: None Code Status: Full   Pt Overview and Major Events to Date:  5/2: Admitted 5/4: 1u PRBC  Assessment and Plan:  Leah Woodward 68 y.o. admitted for AMS and suspected alcohol withdrawal. CIWAs stable overnight. Required 1u pRBC for anemia, with stable Hgb thereafter.   Assessment & Plan Altered mental status  Alcohol Use Disorder Mentation continues to wax and wane, overall improving from admission. CIWAs 1-2 ON, with morning score of 5. Leading differentials for AMS include alcohol withdrawal and Warnicke Korsakoff. Could also be exacerbated by hospital delirium.  - Cont Librium  taper (TID, then BID, then daily, then stop - end 5/7) - Consider mood assessment and possible SSRI following Librium  taper  - Cont Atarax 25 mg every 6 hours as needed for anxiety - High dose IV thiamine  daily  - CIWAs q4h - Delirium, seizure and fall precautions  - PT/OT eval  - AM CBC, BMP Tachycardia Continue mild tachycardia, likely 2/2 alcohol withdrawal. Had minimal improvement with IVF previously. EKG cw sinus tach. Could also be related to anemia (see below) - Tx alcohol withdrawal and anemia - Cont D5LR mIVF given decreased PO intake  - am CBC Anemia S/p 1u PRBC with improvement of Hgb, stable at 9.5 this morning. Has hx of CAD. No known active bleeding symptoms.  - Monitor for bleeding - Transfuse <8 - Cont SCDs for VTE prophylaxis  Fever Had fever 5/3 (Tmax 101.1), resolved with Tylenol . No new infectious symptoms, no leukocytosis, CXR unremarkable.  RPP negative. Suspect related to alcohol withdrawal. - Blood cultures NG < 12 h -Tylenol  as needed for fever - Monitor fever curve Left leg pain Likely related to sacral and left hip pressure ulcers.   Left hip, left foot, left knee x-ray unremarkable. - Continue wound care Hypokalemia AM K 3.5, stable s/p repletion. Suspect 2/2 poor PO.  - AM BMP Decreased oral intake Continues to decline food intake. Per family, she does not eat well chronically. Belly distended this morning, unknown last BM per nursing.  - 125 mL/hr D5LR mIVF until PO improves - Follow RD recs - Consider CT Abd/Pel for abdominal changes  Chronic health problem HTN: Not on any BP meds at home. Normotensive overnight, CTM.   FEN/GI: Dysphagia 1 PPx: SCDs Dispo: SNF pending clinical improvement   Subjective:  Reports last BM 2 days ago with brown appearance, not bloody or black. No blood in urine. Has not been eating much, no appetite.   Objective: Temp:  [97.5 F (36.4 C)-99.1 F (37.3 C)] 97.5 F (36.4 C) (05/05 0602) Pulse Rate:  [105-120] 110 (05/05 0602) Resp:  [16-18] 18 (05/04 1733) BP: (119-166)/(77-98) 133/81 (05/05 0602) SpO2:  [87 %-100 %] 87 % (05/05 0602) Physical Exam: General: No acute distress. Cachetic. Resting comfortably. CV: Normal S1/S2. No extra heart sounds. Warm and well-perfused. Pulm: Breathing comfortably on room air. CTAB anteriorly. No increased WOB. Abd: Normoactive BS. Moderate distention throughout. Tender to deep palpation. No rebound or guarding.  Skin:  Warm, dry.  Neuro: Sleeping, easily awoken by voice. Oriented to name, birth date, and place. Reports year as 1925 instead of 2025. Pupils ~1-12mm bilaterally, minimally reactive to light. Facial sensation intact bilaterally. Facial expressions symmetric. Tongue midline with fasciculations. Slurred speech. 4/5 extremity strength.    Laboratory: Most  recent CBC Lab Results  Component Value Date   WBC 12.8 (H) 04/09/2024   HGB 9.5 (L) 04/09/2024   HCT 28.4 (L) 04/09/2024   MCV 88.5 04/09/2024   PLT 280 04/09/2024   Most recent BMP    Latest Ref Rng & Units 04/09/2024    5:06 AM  BMP  Glucose 70 - 99 mg/dL 161   BUN  8 - 23 mg/dL <5   Creatinine 0.96 - 1.00 mg/dL 0.45   Sodium 409 - 811 mmol/L 136   Potassium 3.5 - 5.1 mmol/L 3.5   Chloride 98 - 111 mmol/L 104   CO2 22 - 32 mmol/L 23   Calcium 8.9 - 10.3 mg/dL 9.2    Carey Chapman, MD 04/09/2024, 7:08 AM  PGY-1, [facility] FPTS Intern pager: [phone], text pages welcome Secure chat group [facility] Teaching Service    Addendum 13:45 Per attending, patient now requiring sternal rub to awaken.  - Per SLP, NPO diet  - Convert folic acid  to IV, continue IV thiamine   - Discussed with pharmacy - will give 1x Ativan  today in place of Librium  taper  - Monitor closely for seizure symptoms and redose Ativan  as indicated  - VBG ordered
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
## 2024-04-09 NOTE — TOC Progression Note (Signed)
 Transition of Care [facility]) - Progression Note    Patient Details  Name: Leah Woodward MRN: [id number] Date of Birth: 1955-12-14  [facility] CM/SW Contact  Tawanda Schall A [place], LCSW Phone Number: 04/09/2024, 4:50 PM  Clinical Narrative:     CSW contacted [name], to provide bed offers with Medicare.gov ratings. They stated they would want [facility] for placement. CSW to reach out to [facility] regarding bed availability and facility selection.  TOC will continue to follow.    Expected Discharge Plan: Skilled Nursing Facility Barriers to Discharge: Continued Medical Work up, SNF Pending bed offer, Insurance Authorization  Expected Discharge Plan and Services In-house Referral: Clinical Social Work                                             Social Determinants of Health (SDOH) Interventions SDOH Screenings   Food Insecurity: No Food Insecurity (04/08/2024)  Housing: Low Risk  (04/08/2024)  Transportation Needs: No Transportation Needs (04/08/2024)  Utilities: Not At Risk (04/08/2024)  Depression (PHQ2-9): Medium Risk (03/22/2022)  Social Connections: Moderately Integrated (04/08/2024)  Tobacco Use: High Risk (04/09/2024)    Readmission Risk Interventions     No data to display

## 2024-04-10 ENCOUNTER — Encounter (HOSPITAL_COMMUNITY): Payer: Self-pay | Admitting: Family Medicine

## 2024-04-10 DIAGNOSIS — Z515 Encounter for palliative care: Secondary | ICD-10-CM

## 2024-04-10 DIAGNOSIS — Z7189 Other specified counseling: Secondary | ICD-10-CM | POA: Diagnosis not present

## 2024-04-10 DIAGNOSIS — F1090 Alcohol use, unspecified, uncomplicated: Secondary | ICD-10-CM | POA: Diagnosis not present

## 2024-04-10 DIAGNOSIS — R400 Somnolence: Secondary | ICD-10-CM | POA: Diagnosis not present

## 2024-04-10 LAB — VITAMIN B1: Vitamin B1 (Thiamine): 91.3 nmol/L (ref 66.5–200.0)

## 2024-04-10 MED ORDER — LORAZEPAM 2 MG/ML IJ SOLN
0.5000 mg | Freq: Four times a day (QID) | INTRAMUSCULAR | Status: DC
  Administered 2024-04-10 – 2024-04-11 (×3): 0.5 mg via INTRAVENOUS
  Filled 2024-04-10 (×3): qty 1

## 2024-04-10 MED ORDER — MORPHINE SULFATE (PF) 2 MG/ML IV SOLN
1.0000 mg | INTRAVENOUS | Status: DC | PRN
  Administered 2024-04-11: 1 mg via INTRAVENOUS
  Filled 2024-04-10: qty 1

## 2024-04-10 MED ORDER — LORAZEPAM 2 MG/ML IJ SOLN: 1.0000 mg | INTRAMUSCULAR | Status: DC | PRN

## 2024-04-10 MED ORDER — GLYCOPYRROLATE 0.2 MG/ML IJ SOLN
0.4000 mg | INTRAMUSCULAR | Status: DC
  Administered 2024-04-10 – 2024-04-11 (×5): 0.4 mg via INTRAVENOUS
  Filled 2024-04-10 (×5): qty 2

## 2024-04-10 NOTE — Plan of Care (Signed)
 Called and spoke with patient' sister Adah Acron. Provided daily care updates and addressed any questions. Patient's sister expressed understanding throughout conversation.

## 2024-04-10 NOTE — Plan of Care (Signed)

## 2024-04-10 NOTE — Consult Note (Signed)
 Palliative Medicine Inpatient Consult Note  Consulting Provider: Dr. Fernand Howard  Reason for consult:   Reason for Consult? comfort care, needs dispo   04/10/2024  HPI:  Per intake H&P --> Leah Woodward 68 y.o. admitted for AMS and suspected alcohol withdrawal. CIWAs stable overnight. Required 1u pRBC for anemia, with stable Hgb thereafter. Patient has an acute decline in the last 24 hours leading to the FMT speaking to family to initiate comfort measures. The Palliative care team has been asked to support additional symptom management needs.   Clinical Assessment/Goals of Care:  *Please note that this is a verbal dictation therefore any spelling or grammatical errors are due to the "Dragon Medical One" system interpretation.  I have reviewed medical records including EPIC notes, labs and imaging, received report from bedside RN, assessed the patient who is lying in bed with audible secretion burden.    I spoke with patients oldest sister, Adah Acron over the phone to further discuss diagnosis prognosis, GOC, EOL wishes, disposition and options.   I introduced Palliative Medicine as specialized medical care for people living with serious illness. It focuses on providing relief from the symptoms and stress of a serious illness. The goal is to improve quality of life for both the patient and the family.  Medical History Review and Understanding:  A review of Sandria's past medical history significant for hypertension, depression, hemochromatosis, and alcohol use disorder was completed.  Social History:  Rion is from [facility] [place] .  She was married though for her sister did get a divorce she thinks over the years.  She has 1 daughter who unfortunately had been previously arrested. Talyia is 1 of 11 children and is still heavily involved with her family.  She formally worked at [employer] She is a woman of the [place].  Functional and Nutritional State:  Prior to  hospitalization, Avari has been living an apartment. Per her sister she was self sufficient though had a tendency to drink quite bit and neglect nutrition. Her situation flared up when three grandchildren passed away in a house fire and her daughter got arrested a few years ago.   Advance Directives:  A detailed discussion was had today regarding advanced directives.  Patients decision makers in the setting of her daughters imprisonment are her siblings. Her sister, Adah Acron speaks with all of them regarding decisions and has taken on the role of primary contact.   Code Status:  Concepts specific to code status, artifical feeding and hydration, continued IV antibiotics and rehospitalization was had.  The difference between a aggressive medical intervention path  and a palliative comfort care path for this patient at this time was had.   Ayame is an established DNAR/DNI code status  Discussion:  Patients sister, Adah Acron and I discussed her precipitous decline overtime, her over indulgence in ETOH, and her lack of want/need/desire to eat and drink. Adah Acron understands that she has declined beyond the point of return and we discussed confirmation of the decision to transition [place] towards full comfort care.   Adah Acron and I reviewed the primary medical teams concerns associated with patients marked decline in physical function, nutritional intake, and overall mental state as a direct result of her alcohol use. Patients sister is understanding that her clinical condition had continued to decline despite medical interventions.   Adah Acron and I talked about transition to comfort measures in house and what that would entail inclusive of medications to control pain, dyspnea, agitation, nausea, itching, and hiccups.  We discussed  stopping all uneccessary measures such as cardiac monitoring, blood draws, needle sticks, and frequent vital signs.   Discussed the use of ativan  in ETOH withdrawal,  glycopyrrolate for secretion burden, and morphine for air hunger in detail with patients sister. She is in agreement with these medications to promote comfort.   Utilized reflective listening throughout our time together. Patient sister shares the desire for Yuleidy  to transition to [facility] once medically optimized.   Discussed the importance of continued conversation with family and their  medical providers regarding overall plan of care and treatment options, ensuring decisions are within the context of the patients values and GOCs.  Decision Maker: Wright,Cynthia (Sister): [phone] (Mobile)   SUMMARY OF RECOMMENDATIONS   DNAR/DNI  Comfort Care  Glycopyrrolate 0.4mg  IVP Q4H ATC  Morphine 1-2mg  IVP Q2H PRN dyspnea/pain  Lorazepam  0.5mg  IVP Q6H & 1-2mg  IVP Q2H PRN  Additional comfort medications per [facility]  May have comfort feed from floor stock  Appreciate TOC Making referral to [facility]  Ongoing PMT support  Code Status/Advance Care Planning: DNAR/DNI  Palliative Prophylaxis:  Aspiration, Bowel Regimen, Delirium Protocol, Frequent Pain Assessment, Oral Care, Palliative Wound Care, and Turn Reposition  Additional Recommendations (Limitations, Scope, Preferences): Comfort Care  Psycho-social/Spiritual:  Desire for further Chaplaincy support: Yes  Additional Recommendations: Education on EOL   Prognosis: Limited overall to day.   Discharge Planning: Discharge to [facility] once approved for a bed.   Vitals:   04/10/24 0725 04/10/24 1149  BP: (!) 147/93 (!) 139/94  Pulse: (!) 130 (!) 132  Resp: 18 18  Temp: 97.7 F (36.5 C) 97.7 F (36.5 C)  SpO2: 93% 90%   No intake or output data in the 24 hours ending 04/10/24 1551 Last Weight  Most recent update: 04/09/2024  2:23 PM    Weight  45.2 kg (99 lb 10.4 oz)            Gen:  Elderly AA F chronically ill appearing HEENT: moist mucous membranes CV: Irregular rate and rhythm  PULM:On RA, (+)  rattle, nonlabored ABD: soft/nontender  EXT: No edema  Neuro: Somnolent   PPS: 10%   This conversation/these recommendations were discussed with patient primary care team, Dr. Fernand Howard  Billing based on MDM: High  Problems Addressed: One or more chronic illnesses with severe exacerbation, progression, or side effects of treatment.  Amount and/or Complexity of Data: Category 3:Discussion of management or test interpretation with external physician/other qualified health care professional/appropriate source (not separately reported)  Risks: Parenteral controlled substances, Decision regarding hospitalization or escalation of hospital care, and Decision not to resuscitate or to de-escalate care because of poor prognosis ______________________________________________________ Camille Cedars [facility] Palliative Medicine Team Team Cell Phone: [phone] Please utilize secure chat with additional questions, if there is no response within 30 minutes please call the above phone number  Palliative Medicine Team providers are available by phone from 7am to 7pm daily and can be reached through the team cell phone.  Should this patient require assistance outside of these hours, please call the patient's attending physician.

## 2024-04-10 NOTE — Care Management Important Message (Signed)
 Important Message  Patient Details  Name: Leah Woodward MRN: [id number] Date of Birth: 05/07/1956   Important Message Given:  Yes - Medicare IM     Wynonia Hedges 04/10/2024, 10:11 AM

## 2024-04-10 NOTE — Assessment & Plan Note (Signed)
 Per GOC discussion with family on 5/5, patient now comfort care status.  - Palliative consulted - NPO - patient may eat for comfort  - Glycopyrrolate prn for secretions  - Haldol prn for agitation  - Ativan  prn for anxiety  - Zofran  ODT prn for N/V - No further labs or workup

## 2024-04-10 NOTE — Progress Notes (Signed)
  Progress Note   Date: 04/10/2024  Patient Name: Leah Woodward        MRN#: [id number]  Clarification of diagnosis:  Moderate malnutrition

## 2024-04-10 NOTE — Progress Notes (Signed)
 Psychosocial Progressive/Outcome: ANOx1/2 at times  Family at bedside    Pain/Comfort Progression/Outcome: Pt did not complain or show signs of pain    Clinical Progression/Outcome: Not alert enough for oral intake  Turned and repositioned during shift  Incon X2  No BM Dressing changed - D/C/I Slept in between care Maintained safety  Suction at bed side

## 2024-04-10 NOTE — Progress Notes (Signed)
     Daily Progress Note Intern Pager: [phone]  Patient name: Leah Woodward Medical record number: [id number] Date of birth: [birth year] Age: 68 y.o. Gender: female  Primary Care Provider: Ernestina Headland, MD Consultants: None Code Status: DNR Comfort Care  Pt Overview and Major Events to Date:  5/2: Admitted 5/4: 1 u pRBC 5/5: Code status changed to DNR Comfort Care  Assessment and Plan:  Leah Woodward 68 y.o. admitted for AMS and suspected alcohol withdrawal. Given overall decline in function and discussion with family, patient now comfort care status. Awaiting palliative recs.  Assessment & Plan Altered mental status  Comfort care patient Per GOC discussion with family on 5/5, patient now comfort care status.  - Palliative consulted - NPO - patient may eat for comfort  - Glycopyrrolate prn for secretions  - Haldol prn for agitation  - Ativan  prn for anxiety  - Zofran  ODT prn for N/V - No further labs or workup   FEN/GI: NPO with eating for comfort  PPx: N/a Dispo: SNF pending availability/palliative recommendations   Subjective:  Asked for another blanket. Otherwise no new complaints.   Objective: Temp:  [97.7 F (36.5 C)-98.6 F (37 C)] 98.6 F (37 C) (05/06 0500) Pulse Rate:  [99-125] 125 (05/06 0500) Resp:  [18] 18 (05/06 0500) BP: (131-147)/(86-98) 131/97 (05/06 0500) SpO2:  [90 %-100 %] 100 % (05/06 0500) Weight:  [45.2 kg] 45.2 kg (05/05 1423) Physical Exam: General: No acute distress, chronically-ill appearing. Resting comfortably in room. CV: Normal S1/S2. No extra heart sounds. Warm and well-perfused. Pulm: Breathing comfortably on room air. CTAB anteriorly. No increased WOB.  Laboratory: Most recent CBC Lab Results  Component Value Date   WBC 12.8 (H) 04/09/2024   HGB 9.5 (L) 04/09/2024   HCT 28.4 (L) 04/09/2024   MCV 88.5 04/09/2024   PLT 280 04/09/2024   Most recent BMP    Latest Ref Rng & Units 04/09/2024    5:06 AM  BMP   Glucose 70 - 99 mg/dL 536   BUN 8 - 23 mg/dL <5   Creatinine 6.44 - 1.00 mg/dL 0.34   Sodium 742 - 595 mmol/L 136   Potassium 3.5 - 5.1 mmol/L 3.5   Chloride 98 - 111 mmol/L 104   CO2 22 - 32 mmol/L 23   Calcium 8.9 - 10.3 mg/dL 9.2     Carey Chapman, MD 04/10/2024, 7:02 AM  PGY-1, [facility] Family Medicine FPTS Intern pager: [phone], text pages welcome Secure chat group [facility] Teaching Service

## 2024-04-10 NOTE — TOC Progression Note (Signed)
 Transition of Care [facility]) - Progression Note    Patient Details  Name: LEATRICE KNACK MRN: [id number] Date of Birth: [birth year]  Transition of [facility]) CM/SW Contact  Dane Dung, RN Phone Number: 04/10/2024, 1:52 PM  Clinical Narrative:    CM spoke with attending physician this morning and patient's family request was made to have referral placed with [facility] for inpatient admission to [facility] for inpatient hospice placement.  I spoke with Ardine Beckwith, CM with [facility] and referral was placed for request to evaluate for [facility] place admission.  DNR placed on the chart to be co-signed by the attending MD.   Expected Discharge Plan: Skilled Nursing Facility Barriers to Discharge: Continued Medical Work up, SNF Pending bed offer, English as a second language teacher  Expected Discharge Plan and Services In-house Referral: Clinical Social Work                                             Social Determinants of Health (SDOH) Interventions SDOH Screenings   Food Insecurity: No Food Insecurity (04/08/2024)  Housing: Low Risk  (04/08/2024)  Transportation Needs: No Transportation Needs (04/08/2024)  Utilities: Not At Risk (04/08/2024)  Depression (PHQ2-9): Medium Risk (03/22/2022)  Social Connections: Moderately Integrated (04/08/2024)  Tobacco Use: High Risk (04/10/2024)    Readmission Risk Interventions     No data to display

## 2024-04-11 DIAGNOSIS — Z515 Encounter for palliative care: Secondary | ICD-10-CM | POA: Diagnosis not present

## 2024-04-11 DIAGNOSIS — Z7189 Other specified counseling: Secondary | ICD-10-CM | POA: Diagnosis not present

## 2024-04-11 MED ORDER — POLYVINYL ALCOHOL 1.4 % OP SOLN
1.0000 [drp] | Freq: Four times a day (QID) | OPHTHALMIC | Status: DC
  Filled 2024-04-11: qty 15

## 2024-04-11 MED ORDER — MORPHINE SULFATE (PF) 2 MG/ML IV SOLN
2.0000 mg | Freq: Three times a day (TID) | INTRAVENOUS | Status: DC
  Administered 2024-04-11: 2 mg via INTRAVENOUS
  Filled 2024-04-11: qty 1

## 2024-04-11 MED ORDER — KETOROLAC TROMETHAMINE 15 MG/ML IJ SOLN: 15.0000 mg | Freq: Three times a day (TID) | INTRAMUSCULAR | Status: DC

## 2024-04-11 MED ORDER — ACETAMINOPHEN 10 MG/ML IV SOLN: 650.0000 mg | Freq: Once | INTRAVENOUS | Status: DC

## 2024-04-11 MED ORDER — ACETAMINOPHEN 10 MG/ML IV SOLN
1000.0000 mg | Freq: Once | INTRAVENOUS | Status: AC
  Administered 2024-04-11: 1000 mg via INTRAVENOUS
  Filled 2024-04-11: qty 100

## 2024-04-11 MED ORDER — SCOPOLAMINE 1 MG/3DAYS TD PT72
1.0000 | MEDICATED_PATCH | TRANSDERMAL | Status: DC
  Filled 2024-04-11: qty 1

## 2024-04-12 LAB — CULTURE, BLOOD (ROUTINE X 2)
Culture: NO GROWTH
Culture: NO GROWTH

## 2024-05-06 NOTE — Assessment & Plan Note (Addendum)
 Per GOC discussion with family on 5/5, patient now comfort care status.  - Appreciate Palliative recs - 1x IV Tylenol  ordered given recorded fever  - Glycopyrrolate q4 ATC for secretions  - Morphine 2mg  TID and 1-2mg  q2 prn for dyspnea/pain - Ativan  0.5 q6 and 1-2 q2 prn  - Toradol 15 q8 - Scopolamine patch  - Haldol prn for agitation  - Liquifilm tears QID - NPO though may eat for comfort  - No further labs or workup

## 2024-05-06 NOTE — Plan of Care (Signed)
 Called patient's sister, Adah Acron, along with Dr Annabell Key to share the unfortunate news of the patient's passing. Encouraged sister and family to visit as they are able. Sister expressed understanding. Patient's family members have been incredibly kind and loving throughout patient stay.

## 2024-05-06 NOTE — Plan of Care (Signed)
   Problem: Coping: Goal: Level of anxiety will decrease Outcome: Progressing

## 2024-05-06 NOTE — Progress Notes (Signed)
   Palliative Medicine Inpatient Follow Up Note HPI: Leah Woodward 68 y.o. admitted for AMS and suspected alcohol withdrawal. CIWAs stable overnight. Required 1u pRBC for anemia, with stable Hgb thereafter. Patient has an acute decline in the last 24 hours leading to the FMT speaking to family to initiate comfort measures. The Palliative care team has been asked to support additional symptom management needs.   Today's Discussion 04/09/2024  *Please note that this is a verbal dictation therefore any spelling or grammatical errors are due to the "Dragon Medical One" system interpretation.  Leah Woodward is in the active end of life process due to severe mental decline from ETOH abuse and ongoing aspiration events.  Chart reviewed inclusive of vital signs, progress notes, laboratory results, and diagnostic images.   I met with Leah Woodward at bedside this morning. She is visibly in distress per her rapid respiratory rate, increased heart rate, and secretion burden.   I spoke to patients RN about liberalizing medications such as morphine and ativan  atc. We in addition discussed modalities to reduce patients fever and ongoing secretion burden. We reviewed that it appears patient is near close to end of life.   Per secure chat with primary team comfort medications will be liberalized.   Questions and concerns addressed/Palliative Support Provided.   Objective Assessment: Vital Signs Vitals:   04/10/24 1947 05/01/2024 0506  BP: (!) 148/92   Pulse: (!) 152   Resp: 20   Temp: 98.9 F (37.2 C) (!) 102.8 F (39.3 C)  SpO2: 100%    No intake or output data in the 24 hours ending 04/23/2024 1207 Last Weight  Most recent update: 04/09/2024  2:23 PM    Weight  45.2 kg (99 lb 10.4 oz)            Gen:  Elderly AA F chronically ill appearing HEENT: moist mucous membranes CV: Irregular rate and rhythm  PULM:On RA, (+) rattle, nonlabored ABD: soft/nontender  EXT: No edema  Neuro: Somnolent    SUMMARY OF RECOMMENDATIONS   DNAR/DNI   Comfort Care   Glycopyrrolate 0.4mg  IVP Q4H ATC  Added morphine 2mg  TID ATC  Added Tordol 15mg  Q8H ATC x3 doses   Morphine 1-2mg  IVP Q2H PRN dyspnea/pain   Lorazepam  0.5mg  IVP Q6H & 1-2mg  IVP Q2H PRN   Additional comfort medications per [facility]   May have comfort feed from floor stock   Anticipate in hospital death   Ongoing PMT support  Billing based on MDM: High in the setting of review and modification of controlled parenterally provided medications ______________________________________________________________________________________ Camille Cedars [facility] Palliative Medicine Team Team Cell Phone: [phone] Please utilize secure chat with additional questions, if there is no response within 30 minutes please call the above phone number  Palliative Medicine Team providers are available by phone from 7am to 7pm daily and can be reached through the team cell phone.  Should this patient require assistance outside of these hours, please call the patient's attending physician.

## 2024-05-06 NOTE — Death Summary Note (Addendum)
 Family Medicine Teaching [facility] Death Summary  Patient name: Leah Woodward Medical record number: [id number] Date of birth: Dec 12, 1955 Age: 68 y.o. Gender: female Date of Admission: 04-29-2024  Date of Death: 05-04-2024, 11:17AM  Admitting Physician: Charise Companion, MD  Primary Care Provider: Ernestina Headland, MD Consultants: Palliative  Indication for Hospitalization: AMS  Discharge Diagnoses/Problem List:  Principal Problem for Admission: AMS Other Problems addressed during stay:  Principal Problem:   Altered mental status  Comfort care patient Active Problems:   Tachycardia   Anemia   Chronic health problem   Alcohol use disorder   Hypokalemia   Left leg pain   Fever   Pressure injury of skin   Decreased oral intake   Malnutrition of moderate degree   Palliative care status   Brief Hospital Course:  Leah Woodward is a 68 y.o. female admitted for altered mental status after being found down in her home. Pertinent PMH/PSH includes hypertension, depression, alcohol use disorder. Her hospital course is outlined below:  AMS  Alcohol Use Disorder:  Patient initially presented to the ED after being found down in her home.  She has a significant history of alcohol use disorder including alcohol withdrawal seizures.  She had been previously started on a Librium  taper but it was unclear if/how she was after taking this at home.  In the ED she was severely altered.  UDS was positive for benzos and THC.  CT head and C-spine without acute focal findings.  On exam during admission she had 2-3 beats of clonus on the left foot and tremors significant for possible alcohol withdrawal.  She was restarted on a Librium  taper inpatient. CIWAs remained below threshold for treatment.   On 5/5, patient's mental status declined further and was unable to manage PO. Upon discussion with patient's family, patient status changed to DNR - comfort care. Palliative care consulted and  provided recommendations to optimize comfort care.   Notified by nursing on May 04, 2024 that patient passed at 1117. Suspect AHRF as cause of death. Medical team called and informed patient's sister about patient death. Patient's sister and family were very kind and loving throughout patient's stay.    Significant Procedures: None  Significant Labs and Imaging:     Latest Ref Rng & Units 04/09/2024    5:06 AM 04/08/2024    8:12 PM 04/08/2024    6:16 AM  CBC  WBC 4.0 - 10.5 K/uL 12.8     Hemoglobin 12.0 - 15.0 g/dL 9.5  30.8  7.7   Hematocrit 36.0 - 46.0 % 28.4  30.1  24.3   Platelets 150 - 400 K/uL 280         Latest Ref Rng & Units 04/09/2024    5:06 AM 04/07/2024   11:45 PM 04/07/2024    1:40 AM  CMP  Glucose 70 - 99 mg/dL 657  846  79   BUN 8 - 23 mg/dL 5  7  7    Creatinine 0.44 - 1.00 mg/dL 9.62  9.52  8.41   Sodium 135 - 145 mmol/L 136  136  138   Potassium 3.5 - 5.1 mmol/L 3.5  2.9  3.0   Chloride 98 - 111 mmol/L 104  101  100   CO2 22 - 32 mmol/L 23  24  24    Calcium 8.9 - 10.3 mg/dL 9.2  9.2  9.3     Imaging: CT Head, C-spine 04-29-2024: IMPRESSION: 1. No evidence of acute intracranial abnormality. 2. No  acute cervical spine fracture. 3. Mild chronic small vessel ischemic disease and mild-to-moderate cerebral atrophy. 4. Advanced cervical disc degeneration.  CXR 04/07/24: No active disease  CXR 04/09/24: No active disease   Carey Chapman, MD 04/16/2024, 12:30 PM PGY-1, [facility] Family Medicine

## 2024-05-06 NOTE — Progress Notes (Addendum)
     Daily Progress Note Intern Pager: [phone]  Patient name: Leah Woodward Medical record number: [id number] Date of birth: 02-20-1956 Age: 68 y.o. Gender: female  Primary Care Provider: Ernestina Headland, MD Consultants: Palliative Code Status: DNR- Comfort  Pt Overview and Major Events to Date:  5/2: Admitted 5/4: 1u pRBC 5/5: Code status changed to DNR Comfort Care  Assessment and Plan:  Leah Woodward 68 y.o. admitted for AMS and suspected alcohol withdrawal. Given overall decline in function and discussion with family, patient changed to comfort care status. Palliative following.   Assessment & Plan Altered mental status  Comfort care patient Per GOC discussion with family on 5/5, patient now comfort care status.  - Appreciate Palliative recs - 1x IV Tylenol  ordered given recorded fever  - Glycopyrrolate q4 ATC for secretions  - Morphine 2mg  TID and 1-2mg  q2 prn for dyspnea/pain - Ativan  0.5 q6 and 1-2 q2 prn  - Toradol 15 q8 - Scopolamine patch  - Haldol prn for agitation  - Liquifilm tears QID - NPO though may eat for comfort  - No further labs or workup   FEN/GI: NPO with eating for comfort PPx: None Dispo: SNF vs inpatient hospice pending approval   Subjective:  Sleeping, minimal response to voice.   Objective: Temp:  [97.7 F (36.5 C)-102.8 F (39.3 C)] 102.8 F (39.3 C) (05/07 0506) Pulse Rate:  [130-152] 152 (05/06 1947) Resp:  [18-20] 20 (05/06 1947) BP: (139-148)/(92-94) 148/92 (05/06 1947) SpO2:  [90 %-100 %] 100 % (05/06 1947) Physical Exam: General: Sleeping, diaphoretic. Noisy breathing. Bilateral eyes with crusting.   Laboratory: No new labs.   Carey Chapman, MD 05/04/2024, 7:02 AM  PGY-1, [facility] Family Medicine FPTS Intern pager: [phone], text pages welcome Secure chat group [facility] Teaching Service

## 2024-05-06 DEATH — deceased
